# Patient Record
Sex: Female | Born: 1990 | Race: White | Hispanic: No | Smoking: Former smoker
Health system: Southern US, Community
[De-identification: ages and names within clinical notes are randomized; demographics above are authoritative.]

## PROBLEM LIST (undated history)

## (undated) ENCOUNTER — Emergency Department (HOSPITAL_COMMUNITY): Payer: BLUE CROSS/BLUE SHIELD

## (undated) DIAGNOSIS — J45909 Unspecified asthma, uncomplicated: Secondary | ICD-10-CM

## (undated) HISTORY — PX: SHOULDER SURGERY: SHX246

---
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
## 2010-09-15 IMAGING — CR DG RIBS W/ CHEST 3+V*L*
4 series · 4 of 4 positions shown · non-contrast
Comparison: None

CLINICAL DATA: Trauma, pain.

LEFT RIBS AND CHEST - 3+ VIEW

[w chest pa]
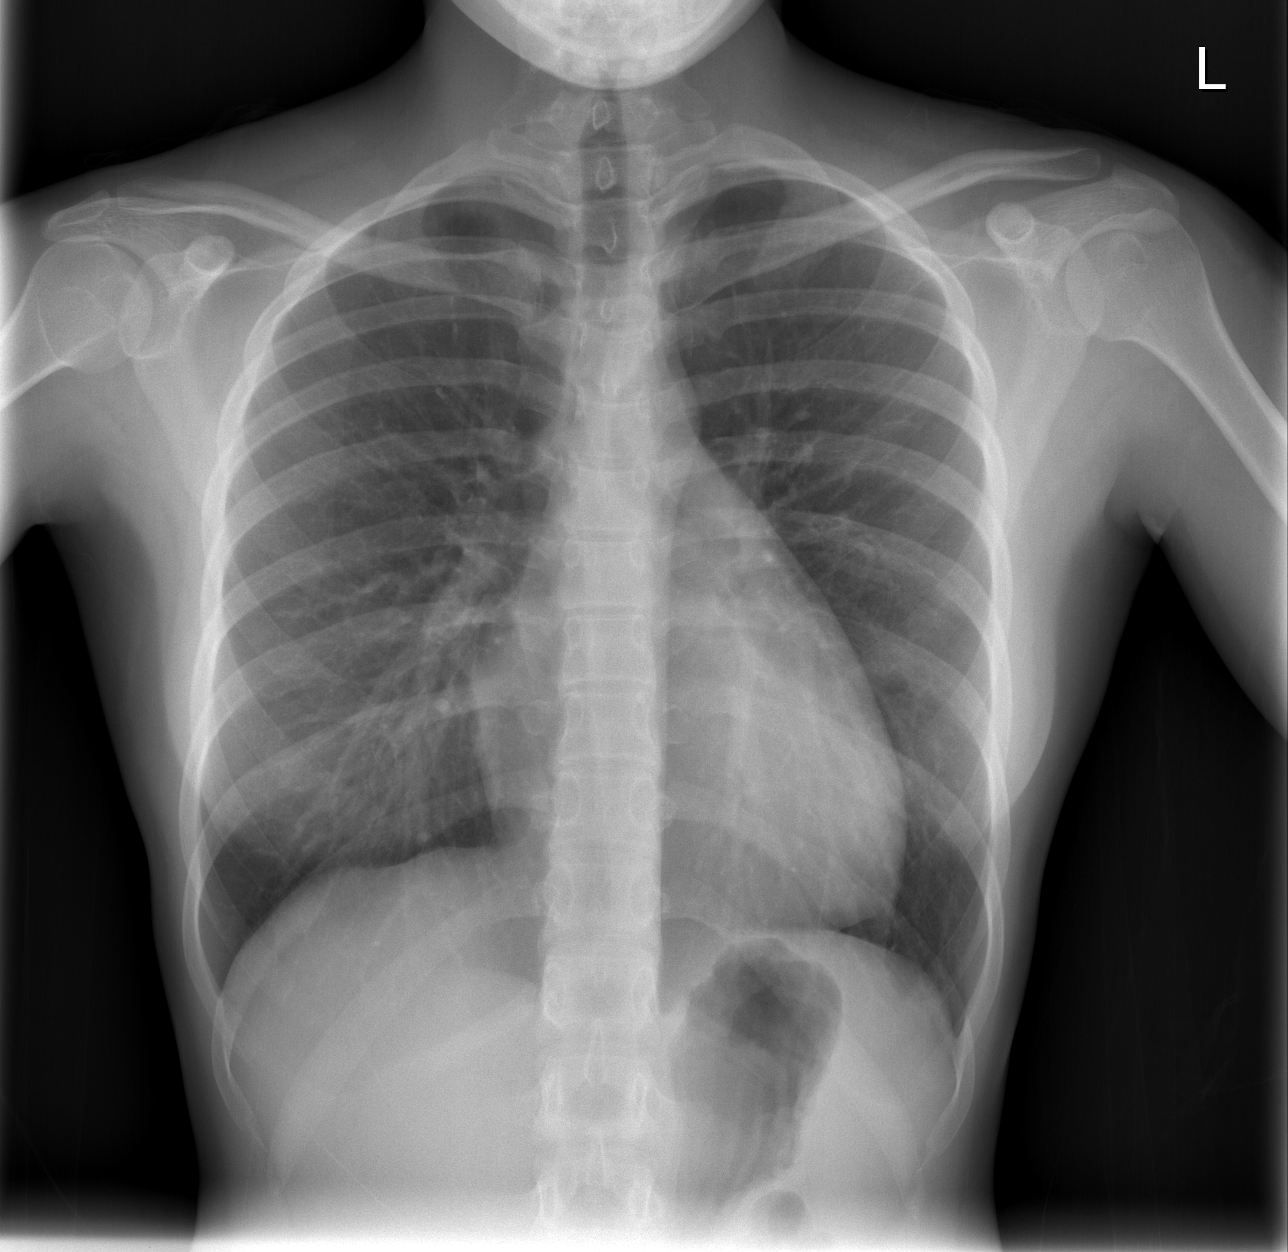

[w ribs ap/pa upper left *]
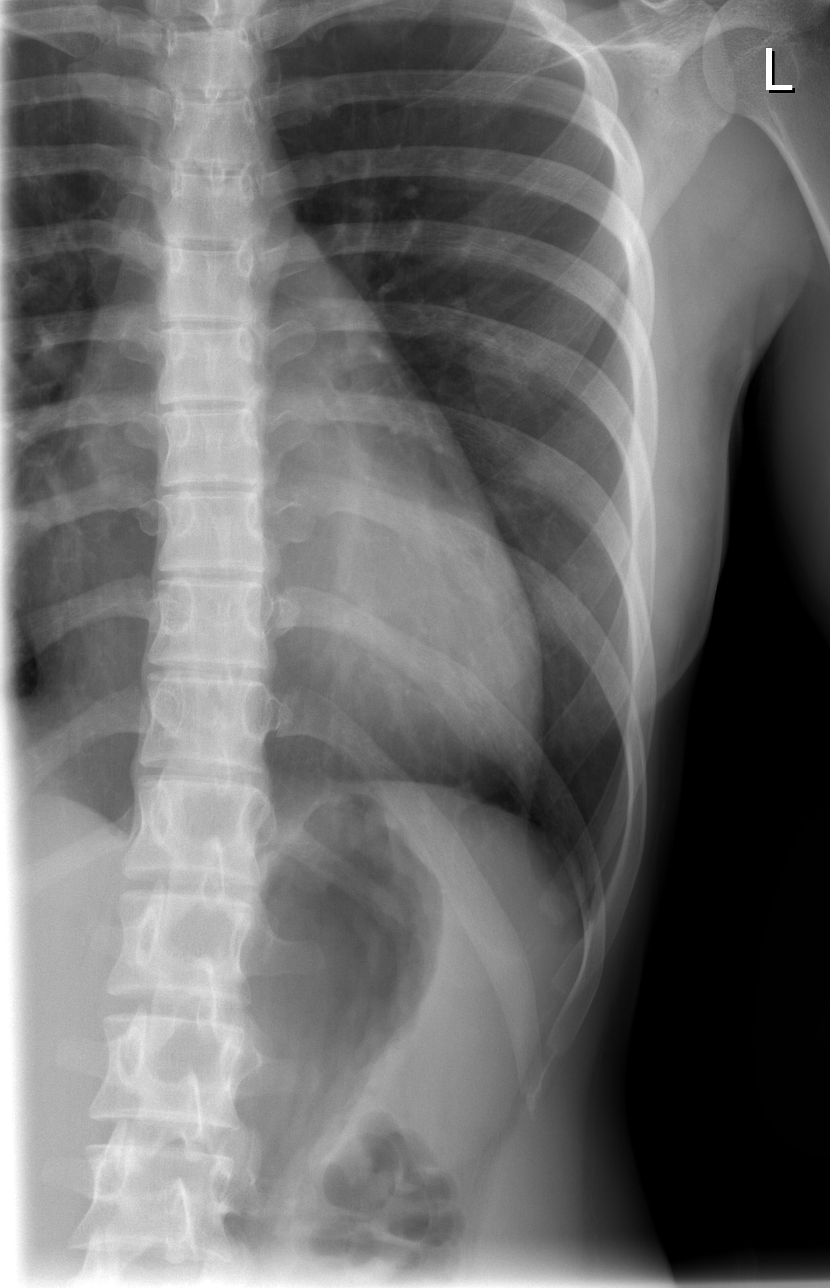

[w ribs ap/pa lower left]
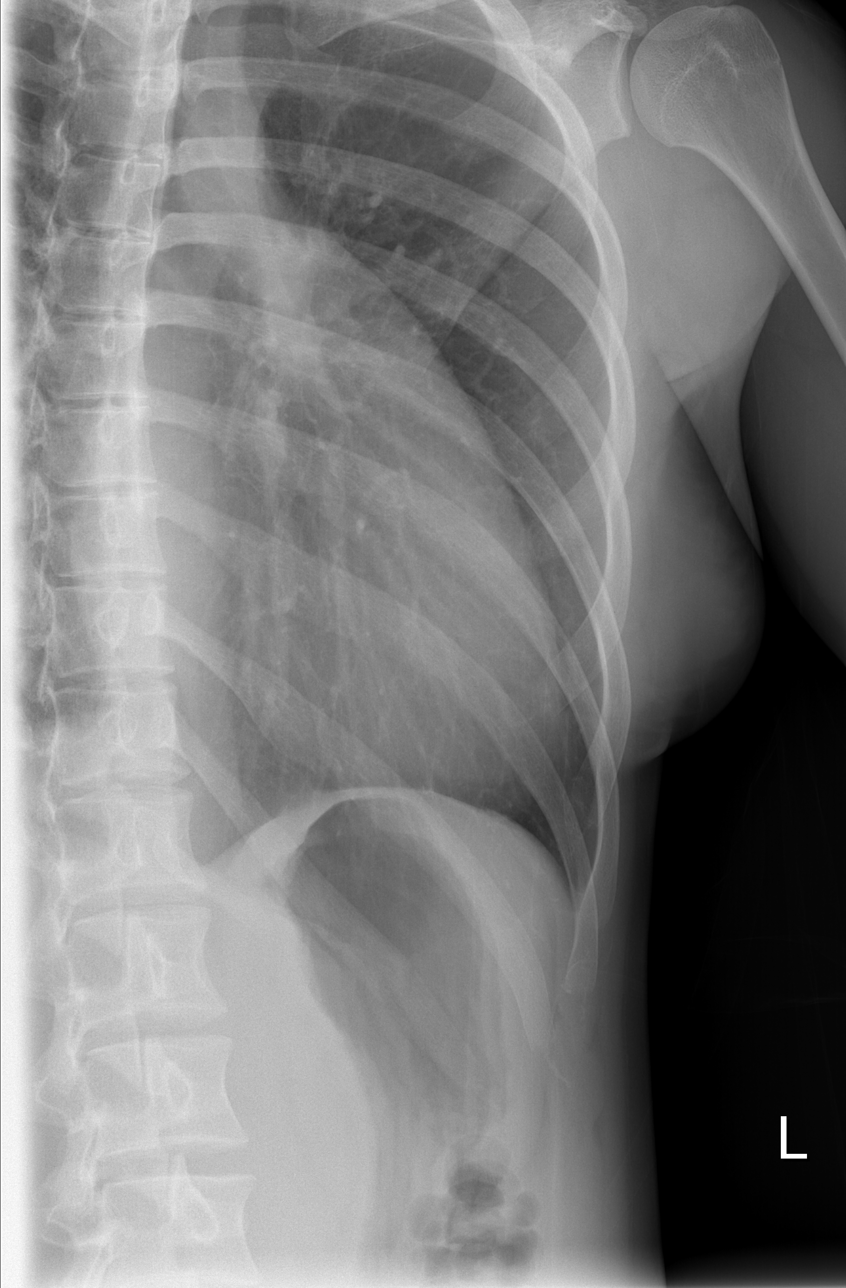

[w ribs oblique left *]
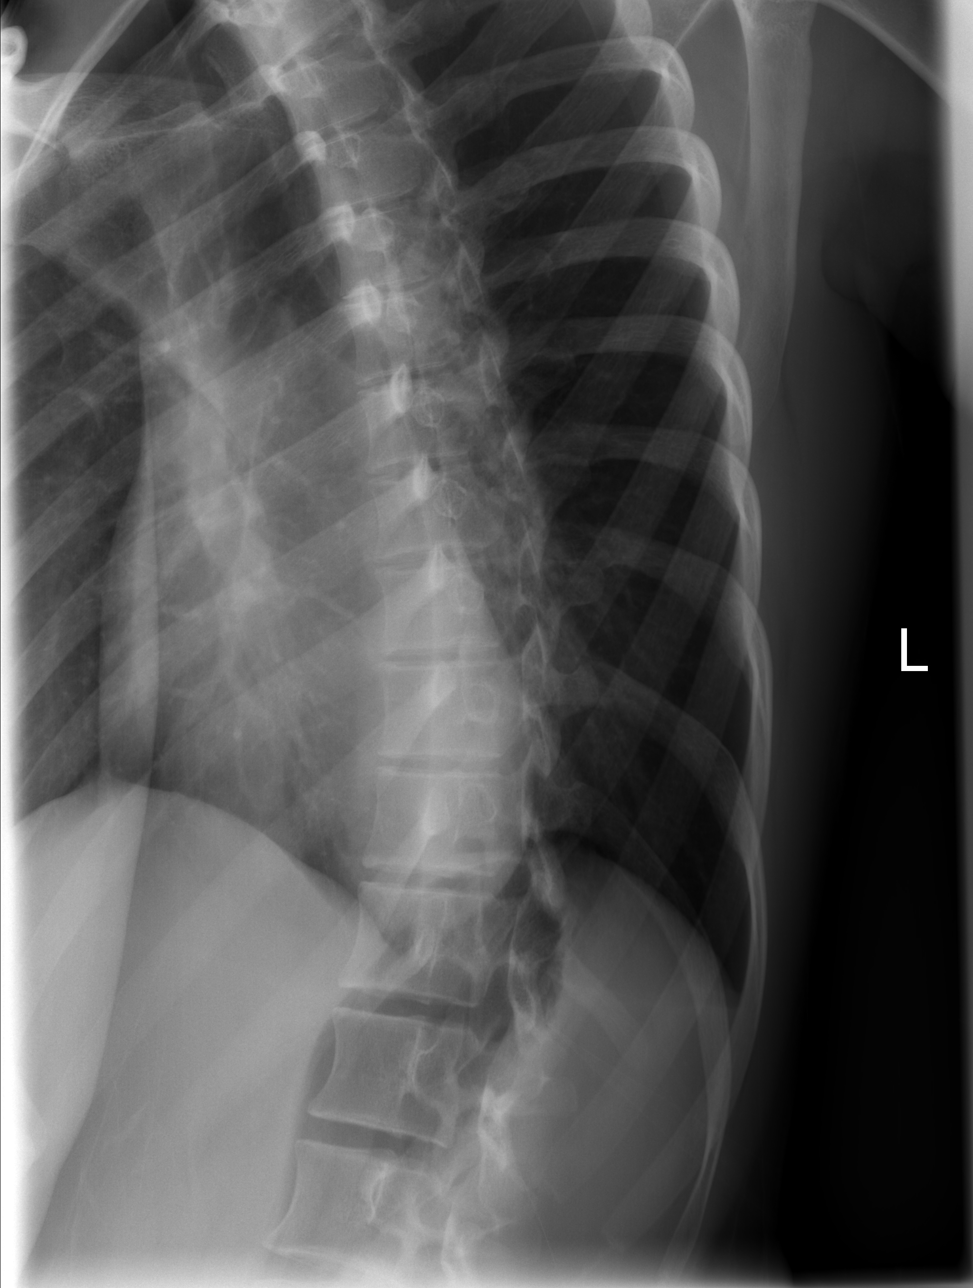

[4 of 4 positions shown; findings below may reference images not displayed]

FINDINGS: Normal heart size and vascularity.  Lungs clear.  No
focal pneumonia, airspace disease, edema, effusion or pneumothorax.
Intact wrist.  No asymmetry.  No subcutaneous air or displaced
fracture.
IMPRESSION: No acute finding by plain radiography.

## 2014-07-10 ENCOUNTER — Encounter: Payer: Self-pay | Admitting: Sports Medicine

## 2014-07-10 ENCOUNTER — Ambulatory Visit (INDEPENDENT_AMBULATORY_CARE_PROVIDER_SITE_OTHER): Payer: BC Managed Care – PPO | Admitting: Sports Medicine

## 2014-07-10 VITALS — Ht 63.0 in | Wt 130.0 lb

## 2014-07-10 DIAGNOSIS — S060X0A Concussion without loss of consciousness, initial encounter: Secondary | ICD-10-CM | POA: Diagnosis not present

## 2014-07-10 NOTE — Assessment & Plan Note (Signed)
-  Mild persistent HA and drowsiness, expected at 4 days post-injury -Counseled on concussion precautions, treatment information given -Note given to keep out of work for next 1 week -Plan re-eval in 1 week or sooner if needed -May start HA tx next week if symptoms are persisting
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
## 2014-07-10 NOTE — Patient Instructions (Signed)
-Note provided to keep out of work for the next 1 week. -Plan follow-up in 1 week or go to the ED if symptoms acutely worsen  Concussion General Instructions  Carefully follow the directions your health care provider gave you.  Only take over-the-counter or prescription medicines for pain, discomfort, or fever as directed by your health care provider.  Take only those medicines that your health care provider has approved.  Do not drink alcohol until your health care provider says you are well enough to do so. Alcohol and certain other drugs may slow your recovery and can put you at risk of further injury.  If it is harder than usual to remember things, write them down.  If you are easily distracted, try to do one thing at a time. For example, do not try to watch TV while fixing dinner.  Talk with family members or close friends when making important decisions.  Keep all follow-up appointments. Repeated evaluation of your symptoms is recommended for your recovery.  Watch your symptoms and tell others to do the same. Complications sometimes occur after a concussion. Older adults with a brain injury may have a higher risk of serious complications, such as a blood clot on the brain.  Tell your teachers, school nurse, school counselor, coach, athletic trainer, or work Production designer, theatre/television/filmmanager about your injury, symptoms, and restrictions. Tell them about what you can or cannot do. They should watch for:  Increased problems with attention or concentration.  Increased difficulty remembering or learning new information.  Increased time needed to complete tasks or assignments.  Increased irritability or decreased ability to cope with stress.  Increased symptoms.  Rest. Rest helps the brain to heal. Make sure you:  Get plenty of sleep at night. Avoid staying up late at night.  Keep the same bedtime hours on weekends and weekdays.  Rest during the day. Take daytime naps or rest breaks when you feel  tired.  Limit activities that require a lot of thought or concentration. These include:  Doing homework or job-related work.  Watching TV.  Working on the computer.  Avoid any situation where there is potential for another head injury (football, hockey, soccer, basketball, martial arts, downhill snow sports and horseback riding). Your condition will get worse every time you experience a concussion. You should avoid these activities until you are evaluated by the appropriate follow-up health care providers. Returning To Your Regular Activities You will need to return to your normal activities slowly, not all at once. You must give your body and brain enough time for recovery.  Do not return to sports or other athletic activities until your health care provider tells you it is safe to do so.  Ask your health care provider when you can drive, ride a bicycle, or operate heavy machinery. Your ability to react may be slower after a brain injury. Never do these activities if you are dizzy.  Ask your health care provider about when you can return to work or school. Preventing Another Concussion It is very important to avoid another brain injury, especially before you have recovered. In rare cases, another injury can lead to permanent brain damage, brain swelling, or death. The risk of this is greatest during the first 7-10 days after a head injury. Avoid injuries by:  Wearing a seat belt when riding in a car.  Drinking alcohol only in moderation.  Wearing a helmet when biking, skiing, skateboarding, skating, or doing similar activities.  Avoiding activities that could lead to  a second concussion, such as contact or recreational sports, until your health care provider says it is okay.  Taking safety measures in your home.  Remove clutter and tripping hazards from floors and stairways.  Use grab bars in bathrooms and handrails by stairs.  Place non-slip mats on floors and in  bathtubs.  Improve lighting in dim areas. SEEK MEDICAL CARE IF:  You have increased problems paying attention or concentrating.  You have increased difficulty remembering or learning new information.  You need more time to complete tasks or assignments than before.  You have increased irritability or decreased ability to cope with stress.  You have more symptoms than before. Seek medical care if you have any of the following symptoms for more than 2 weeks after your injury:  Lasting (chronic) headaches.  Dizziness or balance problems.  Nausea.  Vision problems.  Increased sensitivity to noise or light.  Depression or mood swings.  Anxiety or irritability.  Memory problems.  Difficulty concentrating or paying attention.  Sleep problems.  Feeling tired all the time. SEEK IMMEDIATE MEDICAL CARE IF:  You have severe or worsening headaches. These may be a sign of a blood clot in the brain.  You have weakness (even if only in one hand, leg, or part of the face).  You have numbness.  You have decreased coordination.  You vomit repeatedly.  You have increased sleepiness.  One pupil is larger than the other.  You have convulsions.  You have slurred speech.  You have increased confusion. This may be a sign of a blood clot in the brain.  You have increased restlessness, agitation, or irritability.  You are unable to recognize people or places.  You have neck pain.  It is difficult to wake you up.  You have unusual behavior changes.  You lose consciousness. MAKE SURE YOU:  Understand these instructions.  Will watch your condition.  Will get help right away if you are not doing well or get worse. Document Released: 11/07/2003 Document Revised: 08/22/2013 Document Reviewed: 03/09/2013 [facility] Patient Information 2015 [employer] [place]. This information is not intended to replace advice given to you by your health care provider. Make sure you discuss any  questions you have with your health care provider.

## 2014-07-17 ENCOUNTER — Ambulatory Visit: Payer: BC Managed Care – PPO | Admitting: Sports Medicine

## 2016-09-08 ENCOUNTER — Encounter (HOSPITAL_COMMUNITY): Payer: Self-pay | Admitting: Emergency Medicine

## 2016-09-08 ENCOUNTER — Ambulatory Visit (HOSPITAL_COMMUNITY)
Admission: EM | Admit: 2016-09-08 | Discharge: 2016-09-08 | Disposition: A | Discharge status: Home / Self Care | Payer: Managed Care, Other (non HMO) | Attending: Family Medicine | Admitting: Family Medicine

## 2016-09-08 DIAGNOSIS — R69 Illness, unspecified: Principal | ICD-10-CM

## 2016-09-08 DIAGNOSIS — J111 Influenza due to unidentified influenza virus with other respiratory manifestations: Secondary | ICD-10-CM

## 2016-09-08 HISTORY — DX: Unspecified asthma, uncomplicated: J45.909

## 2016-09-08 MED ORDER — IPRATROPIUM BROMIDE 0.06 % NA SOLN: 2.0000 | Freq: Four times a day (QID) | NASAL | 1 refills | Status: DC

## 2016-09-08 NOTE — ED Provider Notes (Signed)
[facility]    CSN: [id number] Arrival date & time: 09/08/16  1008     History   Chief Complaint Chief Complaint  Patient presents with  . URI    HPI Tiffany Terry is a 26 y.o. female.   The history is provided by the patient.  URI  Presenting symptoms: congestion, cough, fever and rhinorrhea   Severity:  Mild Onset quality:  Gradual Duration:  4 days Progression:  Improving Chronicity:  New Relieved by:  None tried Worsened by:  Nothing Ineffective treatments:  None tried Associated symptoms: myalgias   Risk factors: sick contacts     Past Medical History:  Diagnosis Date  . Asthma     Patient Active Problem List   Diagnosis Date Noted  . Concussion with no loss of consciousness 07/10/2014    Past Surgical History:  Procedure Laterality Date  . ANTERIOR CRUCIATE LIGAMENT (ACL) REVISION Left 2011  . SHOULDER SURGERY Right     OB History    No data available       Home Medications    Prior to Admission medications   Not on File    Family History History reviewed. No pertinent family history.  Social History Social History  Substance Use Topics  . Smoking status: Former Smoker    Quit date: 06/08/2016  . Smokeless tobacco: Never Used  . Alcohol use 0.0 oz/week     Comment: daily beer or two     Allergies   Patient has no known allergies.   Review of Systems Review of Systems  Constitutional: Positive for appetite change and fever.  HENT: Positive for congestion, postnasal drip and rhinorrhea.   Respiratory: Positive for cough.   Cardiovascular: Negative.   Gastrointestinal: Positive for nausea. Negative for vomiting.  Genitourinary: Negative.   Musculoskeletal: Positive for myalgias.     Physical Exam Triage Vital Signs ED Triage Vitals [09/08/16 1048]  Enc Vitals Group     BP 103/60     Pulse Rate 105     Resp      Temp 99.3 F (37.4 C)     Temp Source Oral     SpO2 94 %     Weight      Height      Head  Circumference      Peak Flow      Pain Score 5     Pain Loc      Pain Edu?      Excl. in GC?    No data found.   Updated Vital Signs BP 103/60 (BP Location: Left Arm)   Pulse 105   Temp 99.3 F (37.4 C) (Oral)   LMP 09/07/2016 (Exact Date)   SpO2 94%   Visual Acuity Right Eye Distance:   Left Eye Distance:   Bilateral Distance:    Right Eye Near:   Left Eye Near:    Bilateral Near:     Physical Exam  Constitutional: She is oriented to person, place, and time. She appears well-developed and well-nourished. No distress.  HENT:  Right Ear: External ear normal.  Left Ear: External ear normal.  Nose: Nose normal.  Mouth/Throat: Oropharynx is clear and moist.  Eyes: Pupils are equal, round, and reactive to light.  Neck: Normal range of motion.  Cardiovascular: Normal rate, regular rhythm, normal heart sounds and intact distal pulses.   Pulmonary/Chest: Effort normal and breath sounds normal.  Abdominal: Soft. Bowel sounds are normal.  Lymphadenopathy:    She has  no cervical adenopathy.  Neurological: She is alert and oriented to person, place, and time.  Skin: Skin is warm and dry.  Nursing note and vitals reviewed.    UC Treatments / Results  Labs (all labs ordered are listed, but only abnormal results are displayed) Labs Reviewed - No data to display  EKG  EKG Interpretation None       Radiology No results found.  Procedures Procedures (including critical care time)  Medications Ordered in UC Medications - No data to display   Initial Impression / Assessment and Plan / UC Course  I have reviewed the triage vital signs and the nursing notes.  Pertinent labs & imaging results that were available during my care of the patient were reviewed by me and considered in my medical decision making (see chart for details).  Clinical Course       Final Clinical Impressions(s) / UC Diagnoses   Final diagnoses:  None    New Prescriptions New  Prescriptions   No medications on file     Linna HoffJames D Taniyah Ballow, MD 09/15/16 1447

## 2016-09-08 NOTE — ED Triage Notes (Signed)
Pt has been suffering from nasal congestion, sore throat, cough and body aches for four days.  Pt is unsure if she has had a fever.

## 2017-01-01 ENCOUNTER — Ambulatory Visit: Payer: Managed Care, Other (non HMO) | Admitting: Nurse Practitioner

## 2017-11-10 DIAGNOSIS — M25572 Pain in left ankle and joints of left foot: Secondary | ICD-10-CM | POA: Diagnosis not present

## 2017-11-12 DIAGNOSIS — M25572 Pain in left ankle and joints of left foot: Secondary | ICD-10-CM | POA: Diagnosis not present

## 2017-11-16 DIAGNOSIS — M25572 Pain in left ankle and joints of left foot: Secondary | ICD-10-CM | POA: Diagnosis not present

## 2017-11-19 DIAGNOSIS — M25572 Pain in left ankle and joints of left foot: Secondary | ICD-10-CM | POA: Diagnosis not present

## 2017-11-24 DIAGNOSIS — M25572 Pain in left ankle and joints of left foot: Secondary | ICD-10-CM | POA: Diagnosis not present

## 2017-11-26 DIAGNOSIS — M25572 Pain in left ankle and joints of left foot: Secondary | ICD-10-CM | POA: Diagnosis not present

## 2017-11-30 DIAGNOSIS — M25572 Pain in left ankle and joints of left foot: Secondary | ICD-10-CM | POA: Diagnosis not present

## 2017-12-07 DIAGNOSIS — M25572 Pain in left ankle and joints of left foot: Secondary | ICD-10-CM | POA: Diagnosis not present

## 2018-01-04 DIAGNOSIS — M25572 Pain in left ankle and joints of left foot: Secondary | ICD-10-CM | POA: Diagnosis not present

## 2018-01-25 DIAGNOSIS — F341 Dysthymic disorder: Secondary | ICD-10-CM | POA: Diagnosis not present

## 2018-02-02 DIAGNOSIS — F341 Dysthymic disorder: Secondary | ICD-10-CM | POA: Diagnosis not present

## 2018-02-09 DIAGNOSIS — F341 Dysthymic disorder: Secondary | ICD-10-CM | POA: Diagnosis not present

## 2018-02-22 DIAGNOSIS — F341 Dysthymic disorder: Secondary | ICD-10-CM | POA: Diagnosis not present

## 2018-03-02 DIAGNOSIS — F341 Dysthymic disorder: Secondary | ICD-10-CM | POA: Diagnosis not present

## 2018-03-14 DIAGNOSIS — F341 Dysthymic disorder: Secondary | ICD-10-CM | POA: Diagnosis not present

## 2018-05-18 DIAGNOSIS — Z1329 Encounter for screening for other suspected endocrine disorder: Secondary | ICD-10-CM | POA: Diagnosis not present

## 2018-05-18 DIAGNOSIS — Z0000 Encounter for general adult medical examination without abnormal findings: Secondary | ICD-10-CM | POA: Diagnosis not present

## 2018-05-18 DIAGNOSIS — Z114 Encounter for screening for human immunodeficiency virus [HIV]: Secondary | ICD-10-CM | POA: Diagnosis not present

## 2018-05-18 DIAGNOSIS — Z23 Encounter for immunization: Secondary | ICD-10-CM | POA: Diagnosis not present

## 2018-05-18 DIAGNOSIS — Z118 Encounter for screening for other infectious and parasitic diseases: Secondary | ICD-10-CM | POA: Diagnosis not present

## 2018-05-18 DIAGNOSIS — Z1159 Encounter for screening for other viral diseases: Secondary | ICD-10-CM | POA: Diagnosis not present

## 2018-05-18 DIAGNOSIS — N898 Other specified noninflammatory disorders of vagina: Secondary | ICD-10-CM | POA: Diagnosis not present

## 2018-05-18 DIAGNOSIS — Z6827 Body mass index (BMI) 27.0-27.9, adult: Secondary | ICD-10-CM | POA: Diagnosis not present

## 2018-05-18 DIAGNOSIS — Z130 Encounter for screening for diseases of the blood and blood-forming organs and certain disorders involving the immune mechanism: Secondary | ICD-10-CM | POA: Diagnosis not present

## 2018-05-18 DIAGNOSIS — Z113 Encounter for screening for infections with a predominantly sexual mode of transmission: Secondary | ICD-10-CM | POA: Diagnosis not present

## 2018-05-18 DIAGNOSIS — Z01419 Encounter for gynecological examination (general) (routine) without abnormal findings: Secondary | ICD-10-CM | POA: Diagnosis not present

## 2018-09-19 DIAGNOSIS — F430 Acute stress reaction: Secondary | ICD-10-CM | POA: Diagnosis not present

## 2018-10-10 DIAGNOSIS — F430 Acute stress reaction: Secondary | ICD-10-CM | POA: Diagnosis not present

## 2018-11-07 DIAGNOSIS — F430 Acute stress reaction: Secondary | ICD-10-CM | POA: Diagnosis not present

## 2018-11-09 ENCOUNTER — Other Ambulatory Visit: Payer: Self-pay

## 2018-11-09 ENCOUNTER — Ambulatory Visit: Payer: BLUE CROSS/BLUE SHIELD | Admitting: Sports Medicine

## 2018-11-09 VITALS — BP 118/86 | Ht 63.0 in | Wt 158.0 lb

## 2018-11-09 DIAGNOSIS — S86309A Unspecified injury of muscle(s) and tendon(s) of peroneal muscle group at lower leg level, unspecified leg, initial encounter: Secondary | ICD-10-CM

## 2018-11-09 NOTE — Progress Notes (Signed)
  Tiffany Terry - 28 y.o. female MRN [id number]  Date of birth: 1990-12-15    Chief Complaint: Left ankle pain  HPI:  28 year old female who presents for left ankle pain.  States this pain is been occurring since at least 2016.  It is a lateral pain located inferior to ankle and medial to Achilles tendon.  States that activity makes it worse.  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  She has that by the end of the shift she can barely move her foot.  She takes ibuprofen daily with minimal relief.  Of note patient evaluated back in 2016 at outside with medicine clinic for similar issue.  She is diagnosed with left peroneal tendinitis and no admissions possible concern for left subtalar coalition.  Time she has tried many conservative therapies.  She is tried most anti-inflammatories as well as topical treatments.  She does have orthotics which she wears all the time.  She is also tried peroneal tendon injections as well as boot immobilization.   ROS:     See HPI  PERTINENT  PMH / PSH FH / / SH:  Past Medical, Surgical, Social, and Family History Reviewed & Updated in the EMR.  Pertinent findings include:  No other medical problems  OBJECTIVE: BP 118/86   Ht 5\' 3"  (1.6 m)   Wt 158 lb (71.7 kg)   BMI 27.99 kg/m   Physical Exam:  Vital signs are reviewed.  GEN: Alert and oriented, NAD Pulm: Breathing unlabored PSY: normal mood, congruent affect  MSK: Left ankle Inspection: No swelling or asymmetry appreciated between bilateral ankles. No varus or valgus deformity appreciated. No significant finding with arch. Palpation: Mild tenderness to palpation inferior to lateral ankle and medial to Achilles tendon. Range of motion: Range of motion intact to foot flexion, extension, eversion, inversion, lateral, medial movement Strength: 5/5 strength to foot flexion, extension, eversion, inversion Stability: No evidence of left ankle instability Neurovascular:  Warm and dry, cap refill less than 2 seconds, no focal neurologic deficit Special Test: No abnormality appreciated on talar tilt test, anterior drawer test  Right ankle Inspection: No swelling or asymmetry appreciated between bilateral ankles Palpation: no tenderness to palpation Range of motion: Range of motion intact to foot flexion, extension, eversion, inversion, lateral, medial movement Strength: 5/5 strength to foot flexion, extension, eversion, inversion Stability: No evidence of left ankle instability Neurovascular: Warm and dry, cap refill less than 2 seconds, no focal neurologic deficit Special Test: No abnormality appreciated on talar tilt test, anterior drawer test  Gait: Minimal pronation to left foot with orthotics. When orthotics removed, significant overpronation of left foot. Right foot with notable out-toeing  ASSESSMENT & PLAN:  1.  Left ankle pain Given history, exam, and past imaging peroneal tendinitis remains highest on differential.  There is still the possibility she may have a subtalar collection, but this is felt to be less likely.  Will immobilize patient in boot to allow for inflammation to decrease.  We will see back in 3 weeks and reevaluate.  If no improvement likely next step or more aggressive treatments such as peroneal injection or referral back to surgery.  Patient does have some improvement with walking boot, can consider placing new orthotic shoe. -Follow-up in 3 weeks -Walking boot to immobilize foot for this timeframe -Depending on outcome either referral to surgery or placement of a new orthotic  Myrene Buddy MD PGY-2 Family Medicine Resident

## 2018-11-10 ENCOUNTER — Ambulatory Visit: Payer: 59 | Admitting: Sports Medicine

## 2018-11-30 ENCOUNTER — Ambulatory Visit (INDEPENDENT_AMBULATORY_CARE_PROVIDER_SITE_OTHER): Payer: BLUE CROSS/BLUE SHIELD | Admitting: Sports Medicine

## 2018-11-30 ENCOUNTER — Ambulatory Visit: Payer: BLUE CROSS/BLUE SHIELD | Admitting: Sports Medicine

## 2018-11-30 ENCOUNTER — Other Ambulatory Visit: Payer: Self-pay

## 2018-11-30 VITALS — Ht 63.0 in | Wt 159.0 lb

## 2018-11-30 DIAGNOSIS — M7672 Peroneal tendinitis, left leg: Secondary | ICD-10-CM

## 2018-11-30 NOTE — Progress Notes (Signed)
Virtual Visit via Video Note  I connected with Tiffany Terry on 11/30/18 at 11:00 AM EDT by a video enabled telemedicine application and verified that I am speaking with the correct person using two identifiers.   I discussed the limitations of evaluation and management by telemedicine and the availability of in person appointments. The patient expressed understanding and agreed to proceed.  History of Present Illness: Tiffany Terry is a 28 year old female presents for follow-up on peroneal tendinitis.  She has been in a boot since she was seen on 11/09/2018.  She is doing much better today.  She states the pain is all but gone.  She does not notice any swelling.  She has been off work due to COVID-19.  She states that the stress is done a lot of good.  She has not been doing any home exercises.  She denies any numbness and tingling.  She denies any weakness.   Observations/Objective: Patient no apparent distress.  Oriented x3.  Left ankle exam: Left ankle visualized.  No acute swelling noted.  No acute rash noted.  Full range of motion noted via video.  No physical contact noted patient due to video conference.  Assessment and Plan: Left peroneal tendinitis.  She is doing much better since she was last seen.  I will have her progressively come out of the boot.  I did provide her with some exercises for peroneal tendinitis today.  She can work on these.  We will see her back as needed.  I did advise her she has any issues moving forward, or if the pain gets worse, she should call and schedule for an appointment.  I will consider doing a peroneal tendon injection if she is not any better.  Follow Up Instructions:    I discussed the assessment and treatment plan with the patient. The patient was provided an opportunity to ask questions and all were answered. The patient agreed with the plan and demonstrated an understanding of the instructions.   The patient was advised to call back or seek an in-person  evaluation if the symptoms worsen or if the condition fails to improve as anticipated.  I provided 10 minutes of non-face-to-face time during this encounter.   Gerarda Gunther, MD   I was the preceptor for this visit and available for immediate consultation Marsa Aris, DO

## 2019-02-16 DIAGNOSIS — F1014 Alcohol abuse with alcohol-induced mood disorder: Secondary | ICD-10-CM | POA: Diagnosis not present

## 2019-02-16 DIAGNOSIS — F411 Generalized anxiety disorder: Secondary | ICD-10-CM | POA: Diagnosis not present

## 2019-02-16 DIAGNOSIS — F330 Major depressive disorder, recurrent, mild: Secondary | ICD-10-CM | POA: Diagnosis not present

## 2019-02-16 DIAGNOSIS — F419 Anxiety disorder, unspecified: Secondary | ICD-10-CM | POA: Diagnosis not present

## 2019-03-06 DIAGNOSIS — F1014 Alcohol abuse with alcohol-induced mood disorder: Secondary | ICD-10-CM | POA: Diagnosis not present

## 2019-03-06 DIAGNOSIS — F419 Anxiety disorder, unspecified: Secondary | ICD-10-CM | POA: Diagnosis not present

## 2019-03-06 DIAGNOSIS — F330 Major depressive disorder, recurrent, mild: Secondary | ICD-10-CM | POA: Diagnosis not present

## 2019-03-06 DIAGNOSIS — F411 Generalized anxiety disorder: Secondary | ICD-10-CM | POA: Diagnosis not present

## 2019-04-18 DIAGNOSIS — F411 Generalized anxiety disorder: Secondary | ICD-10-CM | POA: Diagnosis not present

## 2019-04-18 DIAGNOSIS — F419 Anxiety disorder, unspecified: Secondary | ICD-10-CM | POA: Diagnosis not present

## 2019-04-18 DIAGNOSIS — F1014 Alcohol abuse with alcohol-induced mood disorder: Secondary | ICD-10-CM | POA: Diagnosis not present

## 2019-04-18 DIAGNOSIS — F330 Major depressive disorder, recurrent, mild: Secondary | ICD-10-CM | POA: Diagnosis not present

## 2019-06-13 DIAGNOSIS — F1014 Alcohol abuse with alcohol-induced mood disorder: Secondary | ICD-10-CM | POA: Diagnosis not present

## 2019-06-13 DIAGNOSIS — F419 Anxiety disorder, unspecified: Secondary | ICD-10-CM | POA: Diagnosis not present

## 2019-06-13 DIAGNOSIS — F411 Generalized anxiety disorder: Secondary | ICD-10-CM | POA: Diagnosis not present

## 2019-06-13 DIAGNOSIS — F330 Major depressive disorder, recurrent, mild: Secondary | ICD-10-CM | POA: Diagnosis not present

## 2019-07-11 DIAGNOSIS — F1014 Alcohol abuse with alcohol-induced mood disorder: Secondary | ICD-10-CM | POA: Diagnosis not present

## 2019-07-11 DIAGNOSIS — F419 Anxiety disorder, unspecified: Secondary | ICD-10-CM | POA: Diagnosis not present

## 2019-07-11 DIAGNOSIS — F411 Generalized anxiety disorder: Secondary | ICD-10-CM | POA: Diagnosis not present

## 2019-07-11 DIAGNOSIS — F330 Major depressive disorder, recurrent, mild: Secondary | ICD-10-CM | POA: Diagnosis not present

## 2019-07-24 ENCOUNTER — Ambulatory Visit (HOSPITAL_COMMUNITY)
Admission: EM | Admit: 2019-07-24 | Discharge: 2019-07-24 | Disposition: A | Discharge status: Home / Self Care | Payer: 59 | Attending: Family Medicine | Admitting: Family Medicine

## 2019-07-24 ENCOUNTER — Encounter (HOSPITAL_COMMUNITY): Payer: Self-pay

## 2019-07-24 ENCOUNTER — Other Ambulatory Visit: Payer: Self-pay

## 2019-07-24 DIAGNOSIS — Z3202 Encounter for pregnancy test, result negative: Secondary | ICD-10-CM | POA: Diagnosis not present

## 2019-07-24 DIAGNOSIS — R1013 Epigastric pain: Secondary | ICD-10-CM | POA: Insufficient documentation

## 2019-07-24 DIAGNOSIS — N390 Urinary tract infection, site not specified: Secondary | ICD-10-CM | POA: Diagnosis not present

## 2019-07-24 LAB — COMPREHENSIVE METABOLIC PANEL
ALT: 16 U/L (ref 0–44)
AST: 18 U/L (ref 15–41)
Albumin: 4.2 g/dL (ref 3.5–5.0)
Alkaline Phosphatase: 45 U/L (ref 38–126)
Anion gap: 10 (ref 5–15)
BUN: 7 mg/dL (ref 6–20)
CO2: 24 mmol/L (ref 22–32)
Calcium: 9.5 mg/dL (ref 8.9–10.3)
Chloride: 106 mmol/L (ref 98–111)
Creatinine, Ser: 0.69 mg/dL (ref 0.44–1.00)
GFR calc Af Amer: >60 mL/min (ref >60)
GFR calc non Af Amer: >60 mL/min (ref >60)
Glucose, Bld: 81 mg/dL (ref 70–99)
Potassium: 3.9 mmol/L (ref 3.5–5.1)
Sodium: 140 mmol/L (ref 135–145)
Total Bilirubin: 0.5 mg/dL (ref 0.3–1.2)
Total Protein: 7.3 g/dL (ref 6.5–8.1)

## 2019-07-24 LAB — POCT PREGNANCY, URINE: Preg Test, Ur: NEGATIVE

## 2019-07-24 LAB — CBC WITH DIFFERENTIAL/PLATELET
Abs Immature Granulocytes: 0.01 10*3/uL (ref 0.00–0.07)
Basophils Absolute: 0.1 10*3/uL (ref 0.0–0.1)
Basophils Relative: 1 %
Eosinophils Absolute: 0.1 10*3/uL (ref 0.0–0.5)
Eosinophils Relative: 1 %
HCT: 42.1 % (ref 36.0–46.0)
Hemoglobin: 14.3 g/dL (ref 12.0–15.0)
Immature Granulocytes: 0 %
Lymphocytes Relative: 35 %
Lymphs Abs: 2.6 10*3/uL (ref 0.7–4.0)
MCH: 32.9 pg (ref 26.0–34.0)
MCHC: 34 g/dL (ref 30.0–36.0)
MCV: 97 fL (ref 80.0–100.0)
Monocytes Absolute: 0.5 10*3/uL (ref 0.1–1.0)
Monocytes Relative: 6 %
Neutro Abs: 4.2 10*3/uL (ref 1.7–7.7)
Neutrophils Relative %: 57 %
Platelets: 358 10*3/uL (ref 150–400)
RBC: 4.34 MIL/uL (ref 3.87–5.11)
RDW: 12.5 % (ref 11.5–15.5)
WBC: 7.4 10*3/uL (ref 4.0–10.5)
nRBC: 0 % (ref 0.0–0.2)

## 2019-07-24 LAB — POCT URINALYSIS DIP (DEVICE)
Bilirubin Urine: NEGATIVE
Glucose, UA: NEGATIVE mg/dL
Ketones, ur: NEGATIVE mg/dL
Nitrite: NEGATIVE
Protein, ur: NEGATIVE mg/dL
Specific Gravity, Urine: 1.01 (ref 1.005–1.030)
Urobilinogen, UA: 0.2 mg/dL (ref 0.0–1.0)
pH: 6 (ref 5.0–8.0)

## 2019-07-24 LAB — LIPASE, BLOOD: Lipase: 26 U/L (ref 11–51)

## 2019-07-24 LAB — POC URINE PREG, ED: Preg Test, Ur: NEGATIVE

## 2019-07-24 MED ORDER — ALUM & MAG HYDROXIDE-SIMETH 200-200-20 MG/5ML PO SUSP
30.0000 mL | Freq: Once | ORAL | Status: AC
  Administered 2019-07-24: 30 mL via ORAL

## 2019-07-24 MED ORDER — LIDOCAINE VISCOUS HCL 2 % MT SOLN
OROMUCOSAL | Status: AC
  Filled 2019-07-24: qty 15

## 2019-07-24 MED ORDER — OMEPRAZOLE 20 MG PO CPDR: 20.0000 mg | DELAYED_RELEASE_CAPSULE | Freq: Every day | ORAL | 0 refills | Status: DC

## 2019-07-24 MED ORDER — NITROFURANTOIN MONOHYD MACRO 100 MG PO CAPS: 100.0000 mg | ORAL_CAPSULE | Freq: Two times a day (BID) | ORAL | 0 refills | Status: AC

## 2019-07-24 MED ORDER — ALUM & MAG HYDROXIDE-SIMETH 200-200-20 MG/5ML PO SUSP
ORAL | Status: AC
  Filled 2019-07-24: qty 30

## 2019-07-24 MED ORDER — LIDOCAINE VISCOUS HCL 2 % MT SOLN
15.0000 mL | Freq: Once | OROMUCOSAL | Status: AC
  Administered 2019-07-24: 15 mL via ORAL

## 2019-07-24 NOTE — Discharge Instructions (Signed)
Your urine is concerning for UTI so I am going to start treatment with a culture pending to confirm this diagnosis.  Drink plenty of water to empty bladder regularly. Avoid alcohol and caffeine as these may irritate the bladder.   I would also like you to start a daily omeprazole to try to help with upper abdominal pain.  I will call you if any concerning findings with your lab testing.  If any worsening of symptoms- pain, fevers, or otherwise worsening- please return or go to the ER.
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
## 2019-07-24 NOTE — ED Triage Notes (Signed)
Pt presents to UC w/ c/o lower abdominal pain x3 days. Pt states it started at beltline the first day and now radiates to her upper abdomen area. Pt states she has some pain in lower abdomen after emptying bladder.

## 2019-07-25 LAB — URINE CULTURE

## 2019-08-17 ENCOUNTER — Other Ambulatory Visit: Payer: Self-pay | Admitting: Cardiology

## 2019-08-17 DIAGNOSIS — Z20822 Contact with and (suspected) exposure to covid-19: Secondary | ICD-10-CM

## 2019-08-18 LAB — NOVEL CORONAVIRUS, NAA: SARS-CoV-2, NAA: NOT DETECTED

## 2020-05-10 ENCOUNTER — Ambulatory Visit: Payer: 59 | Admitting: Physician Assistant

## 2020-05-16 ENCOUNTER — Other Ambulatory Visit: Payer: Self-pay

## 2020-05-16 ENCOUNTER — Encounter: Payer: Self-pay | Admitting: Psychiatry

## 2020-05-16 ENCOUNTER — Ambulatory Visit (INDEPENDENT_AMBULATORY_CARE_PROVIDER_SITE_OTHER): Payer: 59 | Admitting: Psychiatry

## 2020-05-16 VITALS — BP 140/85 | HR 68 | Ht 64.0 in | Wt 154.0 lb

## 2020-05-16 DIAGNOSIS — F902 Attention-deficit hyperactivity disorder, combined type: Secondary | ICD-10-CM

## 2020-05-16 DIAGNOSIS — F3281 Premenstrual dysphoric disorder: Secondary | ICD-10-CM | POA: Diagnosis not present

## 2020-05-16 MED ORDER — OXCARBAZEPINE 300 MG PO TABS: ORAL_TABLET | ORAL | 1 refills | Status: DC

## 2020-05-16 MED ORDER — LISDEXAMFETAMINE DIMESYLATE 30 MG PO CAPS: 30.0000 mg | ORAL_CAPSULE | Freq: Every day | ORAL | 0 refills | Status: DC
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
## 2020-06-11 ENCOUNTER — Ambulatory Visit (INDEPENDENT_AMBULATORY_CARE_PROVIDER_SITE_OTHER): Payer: 59 | Admitting: Psychiatry

## 2020-06-11 ENCOUNTER — Other Ambulatory Visit: Payer: Self-pay

## 2020-06-11 ENCOUNTER — Encounter: Payer: Self-pay | Admitting: Psychiatry

## 2020-06-11 DIAGNOSIS — F4323 Adjustment disorder with mixed anxiety and depressed mood: Secondary | ICD-10-CM
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
## 2020-06-13 ENCOUNTER — Ambulatory Visit: Payer: 59 | Admitting: Psychiatry

## 2020-06-18 ENCOUNTER — Telehealth: Payer: Self-pay | Admitting: Psychiatry

## 2020-06-18 ENCOUNTER — Other Ambulatory Visit: Payer: Self-pay | Admitting: Psychiatry

## 2020-06-18 DIAGNOSIS — F902 Attention-deficit hyperactivity disorder, combined type: Secondary | ICD-10-CM

## 2020-06-18 MED ORDER — LISDEXAMFETAMINE DIMESYLATE 30 MG PO CAPS: 30.0000 mg | ORAL_CAPSULE | Freq: Every day | ORAL | 0 refills | Status: DC

## 2020-06-18 NOTE — Telephone Encounter (Signed)
Pt called and needs a refill on her vyvanse 30 mg to be [facility] [address]

## 2020-06-18 NOTE — Telephone Encounter (Signed)
Patient was no show with Shanda Bumps on 06/13/20, she is rescheduled. Okay to pend?

## 2020-06-18 NOTE — Telephone Encounter (Signed)
Rx sent 

## 2020-06-28 ENCOUNTER — Other Ambulatory Visit: Payer: Self-pay

## 2020-06-28 ENCOUNTER — Encounter (INDEPENDENT_AMBULATORY_CARE_PROVIDER_SITE_OTHER): Payer: Self-pay | Admitting: Otolaryngology

## 2020-06-28 ENCOUNTER — Ambulatory Visit (INDEPENDENT_AMBULATORY_CARE_PROVIDER_SITE_OTHER): Payer: 59 | Admitting: Otolaryngology

## 2020-06-28 VITALS — Temp 97.7°F

## 2020-06-28 DIAGNOSIS — J320 Chronic maxillary sinusitis: Secondary | ICD-10-CM

## 2020-06-28 NOTE — Progress Notes (Signed)
HPI: Tiffany Terry is a 29 y.o. female who presents for evaluation of chronic sinus infection.  She has been having sinus issues now for about 4 months.  This began with blockage discomfort in the right nostril area she suddenly developed some pain in the right upper molar teeth she was treated for a sinus infection with antibiotics and decongestants but did not seem to help.  She describes pressure in the right cheek occasional pain in the right upper teeth and postnasal drainage.  Past Medical History:  Diagnosis Date  . Asthma    Past Surgical History:  Procedure Laterality Date  . ANTERIOR CRUCIATE LIGAMENT (ACL) REVISION Left 2011  . SHOULDER SURGERY Right    Social History   Socioeconomic History  . Marital status: Single    Spouse name: Not on file  . Number of children: Not on file  . Years of education: Not on file  . Highest education level: Not on file  Occupational History  . Not on file  Tobacco Use  . Smoking status: Current Every Day Smoker    Years: 11.00    Types: Cigarettes    Start date: 2010  . Smokeless tobacco: Never Used  . Tobacco comment: 3 cigarettes a day/smoked off and on  Vaping Use  . Vaping Use: Never used  Substance and Sexual Activity  . Alcohol use: Yes  . Drug use: Not Currently    Types: Marijuana    Comment: twice a month  . Sexual activity: Not on file  Other Topics Concern  . Not on file  Social History Narrative  . Not on file   Social Determinants of Health   Financial Resource Strain:   . Difficulty of Paying Living Expenses: Not on file  Food Insecurity:   . Worried About Programme researcher, broadcasting/film/video in the Last Year: Not on file  . Ran Out of Food in the Last Year: Not on file  Transportation Needs:   . Lack of Transportation (Medical): Not on file  . Lack of Transportation (Non-Medical): Not on file  Physical Activity:   . Days of Exercise per Week: Not on file  . Minutes of Exercise per Session: Not on file  Stress:   .  Feeling of Stress : Not on file  Social Connections:   . Frequency of Communication with Friends and Family: Not on file  . Frequency of Social Gatherings with Friends and Family: Not on file  . Attends Religious Services: Not on file  . Active Member of Clubs or Organizations: Not on file  . Attends Banker Meetings: Not on file  . Marital Status: Not on file   Family History  Problem Relation Age of Onset  . Healthy Mother   . Depression Mother   . Healthy Father   . Depression Father   . ADD / ADHD Father   . Bipolar disorder Sister   . Personality disorder Sister   . Depression Brother   . Anxiety disorder Brother   . Mood Disorder Paternal Uncle   . Alcohol abuse Maternal Grandfather    No Known Allergies Prior to Admission medications   Medication Sig Start Date End Date Taking? Authorizing Provider  albuterol (VENTOLIN HFA) 108 (90 Base) MCG/ACT inhaler Inhale into the lungs. 12/21/19   [provider]  lisdexamfetamine (VYVANSE) 30 MG capsule Take 1 capsule (30 mg total) by mouth daily. 06/18/20   Cottle, Steva Ready., MD  Oxcarbazepine (TRILEPTAL) 300 MG tablet  Take 1/2 tab po QHS x 3-5 days, then increase to 1 tab po QHS 05/16/20   Corie Chiquito, PMHNP     Positive ROS: Otherwise negative  All other systems have been reviewed and were otherwise negative with the exception of those mentioned in the HPI and as above.  Physical Exam: Constitutional: Alert, well-appearing, no acute distress Ears: External ears without lesions or tenderness. Ear canals are clear bilaterally with intact, clear TMs.  Nasal: External nose without lesions. Septum slightly deviated to the right with mild rhinitis..  Nasal endoscopy was performed in the office today and on nasal endoscopy the left middle meatus regions were clear.  The right middle meatus region reveals a thick mucopurulent discharge. Oral: Lips and gums without lesions. Tongue and palate mucosa without  lesions. Posterior oropharynx clear. Neck: No palpable adenopathy or masses Respiratory: Breathing comfortably  Skin: No facial/neck lesions or rash noted.  Nasal/sinus endoscopy  Date/Time: 06/28/2020 4:18 PM Performed by: Drema Halon, MD Authorized by: Drema Halon, MD   Consent:    Consent obtained:  Verbal   Consent given by:  Patient Procedure details:    Indications: sino-nasal symptoms     Medication:  Afrin   Instrument: flexible fiberoptic nasal endoscope     Scope location: bilateral nare   Septum:    normal     Deviation: deviated to the right     Severity of deviation: mild   Sinus:    Right middle meatus: normal     inflammation and purulence     Left middle meatus: normal   Comments:     On nasal endoscopy patient had a mucopurulent discharge from the right middle meatus with a clear left middle meatus.    Assessment: Chronic right maxillary sinusitis.  Plan: Placed on Augmentin 875 mg twice daily for 2 weeks.  Nasacort 2 sprays each nostril at night.  Gave her samples of Norel AD to use as a decongestant during the daytime.  Also discussed with her concerning use of saline rinses if she has thick mucus discharge. She will notify us in 3 weeks if symptoms have not totally resolved and will consider x-ray if needed at that time.  Narda Bonds, MD

## 2020-07-05 ENCOUNTER — Ambulatory Visit (INDEPENDENT_AMBULATORY_CARE_PROVIDER_SITE_OTHER): Payer: 59 | Admitting: Psychiatry

## 2020-07-05 ENCOUNTER — Other Ambulatory Visit: Payer: Self-pay

## 2020-07-05 DIAGNOSIS — F4323 Adjustment disorder with mixed anxiety and depressed mood: Secondary | ICD-10-CM | POA: Diagnosis not present
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
## 2020-07-17 ENCOUNTER — Other Ambulatory Visit: Payer: Self-pay

## 2020-07-17 ENCOUNTER — Ambulatory Visit (INDEPENDENT_AMBULATORY_CARE_PROVIDER_SITE_OTHER): Payer: 59 | Admitting: Psychiatry

## 2020-07-17 DIAGNOSIS — F4323 Adjustment disorder with mixed anxiety and depressed mood: Secondary | ICD-10-CM
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
## 2020-07-30 ENCOUNTER — Other Ambulatory Visit: Payer: Self-pay

## 2020-07-30 ENCOUNTER — Ambulatory Visit (INDEPENDENT_AMBULATORY_CARE_PROVIDER_SITE_OTHER): Payer: 59 | Admitting: Psychiatry

## 2020-07-30 ENCOUNTER — Encounter: Payer: Self-pay | Admitting: Psychiatry

## 2020-07-30 VITALS — BP 138/89 | HR 72

## 2020-07-30 DIAGNOSIS — F902 Attention-deficit hyperactivity disorder, combined type: Secondary | ICD-10-CM

## 2020-07-30 DIAGNOSIS — F3281 Premenstrual dysphoric disorder: Secondary | ICD-10-CM

## 2020-07-30 MED ORDER — LISDEXAMFETAMINE DIMESYLATE 30 MG PO CAPS: 30.0000 mg | ORAL_CAPSULE | Freq: Every day | ORAL | 0 refills | Status: DC

## 2020-07-30 MED ORDER — ADZENYS XR-ODT 6.3 MG PO TBED: 6.3000 mg | EXTENDED_RELEASE_TABLET | Freq: Every morning | ORAL | 0 refills | Status: DC

## 2020-07-30 MED ORDER — OXCARBAZEPINE 300 MG PO TABS: 300.0000 mg | ORAL_TABLET | Freq: Every day | ORAL | 1 refills | Status: DC

## 2020-07-30 NOTE — Progress Notes (Signed)
Tiffany Terry [id number] [birth year] 29 y.o.  Subjective:   Patient ID:  Tiffany Terry is a 30 y.o. (DOB 1990-10-13) female.  Chief Complaint:  Chief Complaint  Patient presents with  . Follow-up    Mood disturbance, Anxiety, ADHD    HPI NALIA Terry presents to the office today for follow-up of mood disturbance, anxiety, and ADHD. She reports that her relationship ended and this led to her moving out and trying to find a new apartment. She reports that Vyvanse has been helpful for concentration and focus. She reports that she has been able to get caught up at work. Has had decreased appetite with Vyvanse and eats a substantial dinner to ensure adequate nutrition. Has been taking snacks to work. She reports that Trileptal has been helpful for her mood and has not had any extemes in mood. Reports that she feels different if she forgets to take Trileptal. She reports that she has had some possible impulsivity. Denies excessive spending. Notices some irritability after EMDR sessions. Denies depressed mood. Anxiety has been manageable. Sleep has been ok and estimates sleeping about 6 hours a night. Energy and motivation have improved with vyvanse. Increased interest in things. Denies SI.   In a new relationship with someone that has been a good friend for a long time.   She reports that ETOH use has been about the same. Denies feeling that ETOH use has been out of control.   Has started seeing Stevphen Meuse, [facility].   Past Psychiatric Medication Trials: Wellbutrin- Adverse reaction. Had dissociation and panic.  Sertraline- Ineffective, no side effects. No worsening anxiety, irritability, or insomnia Concerta Vyvanse  PHQ2-9     Office Visit from 07/10/2014 in [facility]  PHQ-2 Total Score 0       Review of Systems:  Review of Systems  Cardiovascular: Negative for palpitations.  Musculoskeletal: Negative for gait problem.  Neurological: Negative for tremors.   Psychiatric/Behavioral:       Please refer to HPI    Medications: I have reviewed the patient's current medications.  Current Outpatient Medications  Medication Sig Dispense Refill  . [START ON 08/31/2020] lisdexamfetamine (VYVANSE) 30 MG capsule Take 1 capsule (30 mg total) by mouth daily. 30 capsule 0  . Oxcarbazepine (TRILEPTAL) 300 MG tablet Take 1 tablet (300 mg total) by mouth at bedtime. 90 tablet 1  . albuterol (VENTOLIN HFA) 108 (90 Base) MCG/ACT inhaler Inhale into the lungs.    . Amphetamine ER (ADZENYS XR-ODT) 6.3 MG TBED Take 6.3 mg by mouth in the morning. 30 tablet 0  . benzonatate (TESSALON) 100 MG capsule Take 1-2 capsules (100-200 mg total) by mouth 3 (three) times daily as needed for cough. 21 capsule 0   No current facility-administered medications for this visit.    Medication Side Effects: Appetite Suppression  Allergies: No Known Allergies  Past Medical History:  Diagnosis Date  . Asthma     Family History  Problem Relation Age of Onset  . Healthy Mother   . Depression Mother   . Healthy Father   . Depression Father   . ADD / ADHD Father   . Bipolar disorder Sister   . Personality disorder Sister   . Depression Brother   . Anxiety disorder Brother   . Mood Disorder Paternal Uncle   . Alcohol abuse Maternal Grandfather     Social History   Socioeconomic History  . Marital status: Single    Spouse name: Not on file  .  Number of children: Not on file  . Years of education: Not on file  . Highest education level: Not on file  Occupational History  . Not on file  Tobacco Use  . Smoking status: Current Every Day Smoker    Years: 11.00    Types: Cigarettes    Start date: 2010  . Smokeless tobacco: Never Used  . Tobacco comment: 3 cigarettes a day/smoked off and on  Vaping Use  . Vaping Use: Never used  Substance and Sexual Activity  . Alcohol use: Yes  . Drug use: Not Currently    Types: Marijuana    Comment: twice a month  . Sexual  activity: Not on file  Other Topics Concern  . Not on file  Social History Narrative  . Not on file   Social Determinants of Health   Financial Resource Strain:   . Difficulty of Paying Living Expenses: Not on file  Food Insecurity:   . Worried About Programme researcher, broadcasting/film/video in the Last Year: Not on file  . Ran Out of Food in the Last Year: Not on file  Transportation Needs:   . Lack of Transportation (Medical): Not on file  . Lack of Transportation (Non-Medical): Not on file  Physical Activity:   . Days of Exercise per Week: Not on file  . Minutes of Exercise per Session: Not on file  Stress:   . Feeling of Stress : Not on file  Social Connections:   . Frequency of Communication with Friends and Family: Not on file  . Frequency of Social Gatherings with Friends and Family: Not on file  . Attends Religious Services: Not on file  . Active Member of Clubs or Organizations: Not on file  . Attends Banker Meetings: Not on file  . Marital Status: Not on file  Intimate Partner Violence:   . Fear of Current or Ex-Partner: Not on file  . Emotionally Abused: Not on file  . Physically Abused: Not on file  . Sexually Abused: Not on file    Past Medical History, Surgical history, Social history, and Family history were reviewed and updated as appropriate.   Please see review of systems for further details on the patient's review from today.   Objective:   Physical Exam:  BP 138/89   Pulse 72   LMP 07/17/2020   Physical Exam Constitutional:      General: She is not in acute distress. Musculoskeletal:        General: No deformity.  Neurological:     Mental Status: She is alert and oriented to person, place, and time.     Coordination: Coordination normal.  Psychiatric:        Attention and Perception: Attention and perception normal. She does not perceive auditory or visual hallucinations.        Mood and Affect: Mood normal. Mood is not anxious or depressed. Affect  is not labile, blunt, angry or inappropriate.        Speech: Speech normal.        Behavior: Behavior normal.        Thought Content: Thought content normal. Thought content is not paranoid or delusional. Thought content does not include homicidal or suicidal ideation. Thought content does not include homicidal or suicidal plan.        Cognition and Memory: Cognition and memory normal.        Judgment: Judgment normal.     Comments: Insight intact     Lab  Review:     Component Value Date/Time   NA 140 07/24/2019 1841   K 3.9 07/24/2019 1841   CL 106 07/24/2019 1841   CO2 24 07/24/2019 1841   GLUCOSE 81 07/24/2019 1841   BUN 7 07/24/2019 1841   CREATININE 0.69 07/24/2019 1841   CALCIUM 9.5 07/24/2019 1841   PROT 7.3 07/24/2019 1841   ALBUMIN 4.2 07/24/2019 1841   AST 18 07/24/2019 1841   ALT 16 07/24/2019 1841   ALKPHOS 45 07/24/2019 1841   BILITOT 0.5 07/24/2019 1841   GFRNONAA >60 07/24/2019 1841   GFRAA >60 07/24/2019 1841       Component Value Date/Time   WBC 7.4 07/24/2019 1841   RBC 4.34 07/24/2019 1841   HGB 14.3 07/24/2019 1841   HCT 42.1 07/24/2019 1841   PLT 358 07/24/2019 1841   MCV 97.0 07/24/2019 1841   MCH 32.9 07/24/2019 1841   MCHC 34.0 07/24/2019 1841   RDW 12.5 07/24/2019 1841   LYMPHSABS 2.6 07/24/2019 1841   MONOABS 0.5 07/24/2019 1841   EOSABS 0.1 07/24/2019 1841   BASOSABS 0.1 07/24/2019 1841    No results found for: POCLITH, LITHIUM   No results found for: PHENYTOIN, PHENOBARB, VALPROATE, CBMZ   .res Assessment: Plan:   Pt reports that it is cost prohibitive for her to continue Vyvanse until her insurance changes January 1st and she would like to take an alternative medication to Vyvanse until the start of the year. Discussed potential benefits, risks, and side effects of Adzenys and pt agrees to trial. Will send script for Adzenys 6.3 mg q am for ADHD. Will also send script for Vyvanse 30 mg po q am for August 31, 2020. Continue Trileptal  300 mg po QHS since this has been helpful for her mood and anxiety s/s.  Recommend continuing psychotherapy with Stevphen Meuse, [facility],  Pt to follow-up in 3 months or sooner if clinically indicated.  Patient advised to contact office with any questions, adverse effects, or acute worsening in signs and symptoms.   Leaann was seen today for follow-up.  Diagnoses and all orders for this visit:  Attention deficit hyperactivity disorder (ADHD), combined type -     Amphetamine ER (ADZENYS XR-ODT) 6.3 MG TBED; Take 6.3 mg by mouth in the morning. -     lisdexamfetamine (VYVANSE) 30 MG capsule; Take 1 capsule (30 mg total) by mouth daily.  PMDD (premenstrual dysphoric disorder) -     Oxcarbazepine (TRILEPTAL) 300 MG tablet; Take 1 tablet (300 mg total) by mouth at bedtime.     Please see After Visit Summary for patient specific instructions.  Future Appointments  Date Time Provider Department Center  08/12/2020 10:00 AM Stevphen Meuse, [facility] None  10/28/2020  9:15 AM Corie Chiquito, PM[facility]NP CP-CP None    No orders of the defined types were placed in this encounter.   -------------------------------

## 2020-07-31 ENCOUNTER — Ambulatory Visit (HOSPITAL_COMMUNITY)
Admission: EM | Admit: 2020-07-31 | Discharge: 2020-07-31 | Disposition: A | Discharge status: Home / Self Care | Payer: 59 | Attending: Family Medicine | Admitting: Family Medicine

## 2020-07-31 ENCOUNTER — Other Ambulatory Visit: Payer: Self-pay

## 2020-07-31 DIAGNOSIS — R197 Diarrhea, unspecified: Secondary | ICD-10-CM | POA: Insufficient documentation

## 2020-07-31 DIAGNOSIS — Z20822 Contact with and (suspected) exposure to covid-19: Secondary | ICD-10-CM | POA: Diagnosis not present

## 2020-07-31 DIAGNOSIS — J069 Acute upper respiratory infection, unspecified: Secondary | ICD-10-CM | POA: Diagnosis not present

## 2020-07-31 LAB — SARS CORONAVIRUS 2 (TAT 6-24 HRS): SARS Coronavirus 2: NEGATIVE

## 2020-07-31 MED ORDER — BENZONATATE 100 MG PO CAPS: 100.0000 mg | ORAL_CAPSULE | Freq: Three times a day (TID) | ORAL | 0 refills | Status: DC | PRN

## 2020-07-31 NOTE — ED Triage Notes (Signed)
Pt reports over the weekend following a COVID  Vac. Pt has had multiple Sx's . Pt has a fever today,cough diarrhea and generalized muscle pain.

## 2020-07-31 NOTE — Discharge Instructions (Signed)
Small frequent sips of fluids- Pedialyte, Gatorade, water, broth- to maintain hydration.   Tylenol and/or ibuprofen as needed for pain or fevers.  Over the counter medications as needed.  Tessalon as needed for cough, may not take cough away completely but help with some suppression.  We will notify of you any positive findings or if any changes to treatment are needed. If normal or otherwise without concern to your results, we will not call you. Please log on to your MyChart to review your results if interested in so.   If symptoms worsen or do not improve in the next week to return to be seen or to follow up with your PCP.

## 2020-07-31 NOTE — ED Provider Notes (Signed)
[facility]    CSN: [id number] Arrival date & time: 07/31/20  0850      History   Chief Complaint Chief Complaint  Patient presents with  . Fever  . Nasal Congestion    HPI Tiffany Terry is a 29 y.o. female.   Tiffany Terry presents with complaints of cough, congestion, body aches and diarrhea. Symptoms started the day following her covid-19 vaccine booster. Started feeling unwell 11/28. Two days ago she was coughing so much that it caused her to vomit. No further vomiting. No nausea. Intermittent fevers. Taking liquids. Diarrhea has decreased today. No rash. No shortness of breath. Cough can be productive. No known Terry contacts. Works in an [employer].    ROS per HPI, negative if not otherwise mentioned.      Past Medical History:  Diagnosis Date  . Asthma     Patient Active Problem List   Diagnosis Date Noted  . Concussion with no loss of consciousness 07/10/2014    Past Surgical History:  Procedure Laterality Date  . ANTERIOR CRUCIATE LIGAMENT (ACL) REVISION Left 2011  . SHOULDER SURGERY Right     OB History   No obstetric history on file.      Home Medications    Prior to Admission medications   Medication Sig Start Date End Date Taking? Authorizing Provider  albuterol (VENTOLIN HFA) 108 (90 Base) MCG/ACT inhaler Inhale into the lungs. 12/21/19  Yes [provider]  lisdexamfetamine (VYVANSE) 30 MG capsule Take 1 capsule (30 mg total) by mouth daily. 08/31/20  Yes Corie Chiquito, PMHNP  Oxcarbazepine (TRILEPTAL) 300 MG tablet Take 1 tablet (300 mg total) by mouth at bedtime. 07/30/20 10/28/20 Yes Corie Chiquito, PMHNP  Amphetamine ER (ADZENYS XR-ODT) 6.3 MG TBED Take 6.3 mg by mouth in the morning. 07/30/20   Corie Chiquito, PMHNP  benzonatate (TESSALON) 100 MG capsule Take 1-2 capsules (100-200 mg total) by mouth 3 (three) times daily as needed for cough. 07/31/20   Georgetta Haber, NP    Family History Family History    Problem Relation Age of Onset  . Healthy Mother   . Depression Mother   . Healthy Father   . Depression Father   . ADD / ADHD Father   . Bipolar disorder Sister   . Personality disorder Sister   . Depression Brother   . Anxiety disorder Brother   . Mood Disorder Paternal Uncle   . Alcohol abuse Maternal Grandfather     Social History Social History   Tobacco Use  . Smoking status: Current Every Day Smoker    Years: 11.00    Types: Cigarettes    Start date: 2010  . Smokeless tobacco: Never Used  . Tobacco comment: 3 cigarettes a day/smoked off and on  Vaping Use  . Vaping Use: Never used  Substance Use Topics  . Alcohol use: Yes  . Drug use: Not Currently    Types: Marijuana    Comment: twice a month     Allergies   Patient has no known allergies.   Review of Systems Review of Systems   Physical Exam Triage Vital Signs ED Triage Vitals  Enc Vitals Group     BP 07/31/20 0957 140/87     Pulse Rate 07/31/20 0957 81     Resp 07/31/20 0957 18     Temp 07/31/20 0957 98.6 F (37 C)     Temp Source 07/31/20 0957 Oral     SpO2 07/31/20 0957 98 %  Weight 07/31/20 0959 150 lb (68 kg)     Height 07/31/20 0959 5\' 3"  (1.6 m)     Head Circumference --      Peak Flow --      Pain Score 07/31/20 0958 6     Pain Loc --      Pain Edu? --      Excl. in GC? --    No data found.  Updated Vital Signs BP 140/87 (BP Location: Right Arm)   Pulse 81   Temp 98.6 F (37 C) (Oral)   Resp 18   Ht 5\' 3"  (1.6 m)   Wt 150 lb (68 kg)   LMP 07/17/2020   SpO2 98%   BMI 26.57 kg/m    Physical Exam Constitutional:      General: She is not in acute distress.    Appearance: She is well-developed.  HENT:     Nose: Congestion present.     Mouth/Throat:     Mouth: Mucous membranes are moist.  Cardiovascular:     Rate and Rhythm: Normal rate.  Pulmonary:     Effort: Pulmonary effort is normal.  Skin:    General: Skin is warm and dry.  Neurological:     Mental  Status: She is alert and oriented to person, place, and time.      UC Treatments / Results  Labs (all labs ordered are listed, but only abnormal results are displayed) Labs Reviewed  SARS CORONAVIRUS 2 (TAT 6-24 HRS)    EKG   Radiology No results found.  Procedures Procedures (including critical care time)  Medications Ordered in UC Medications - No data to display  Initial Impression / Assessment and Plan / UC Course  I have reviewed the triage vital signs and the nursing notes.  Pertinent labs & imaging results that were available during my care of the patient were reviewed by me and considered in my medical decision making (see chart for details).     Non toxic. Benign physical exam.  No work of breathing. Vitals stable. covid and influenza testing collected and pending, although was just boosted for covid-19. History and physical consistent with viral illness.  Supportive cares recommended. Return precautions provided. Patient verbalized understanding and agreeable to plan.   Final Clinical Impressions(s) / UC Diagnoses   Final diagnoses:  Upper respiratory tract infection, unspecified type  Diarrhea, unspecified type     Discharge Instructions     Small frequent sips of fluids- Pedialyte, Gatorade, water, broth- to maintain hydration.   Tylenol and/or ibuprofen as needed for pain or fevers.  Over the counter medications as needed.  Tessalon as needed for cough, may not take cough away completely but help with some suppression.  We will notify of you any positive findings or if any changes to treatment are needed. If normal or otherwise without concern to your results, we will not call you. Please log on to your MyChart to review your results if interested in so.   If symptoms worsen or do not improve in the next week to return to be seen or to follow up with your PCP.     ED Prescriptions    Medication Sig Dispense Auth. Provider   benzonatate (TESSALON) 100  MG capsule Take 1-2 capsules (100-200 mg total) by mouth 3 (three) times daily as needed for cough. 21 capsule , NP     PDMP not reviewed this encounter.   07/19/2020, NP 07/31/20 1041

## 2020-08-12 ENCOUNTER — Ambulatory Visit: Payer: 59 | Admitting: Psychiatry

## 2020-10-04 ENCOUNTER — Ambulatory Visit (INDEPENDENT_AMBULATORY_CARE_PROVIDER_SITE_OTHER): Payer: BC Managed Care – PPO | Admitting: Psychiatry

## 2020-10-04 ENCOUNTER — Other Ambulatory Visit: Payer: Self-pay

## 2020-10-04 DIAGNOSIS — F4323 Adjustment disorder with mixed anxiety and depressed mood: Secondary | ICD-10-CM | POA: Diagnosis not present

## 2020-10-04 NOTE — Progress Notes (Signed)
      [facility] Counselor/Therapist Progress Note  Patient ID: Tiffany Terry, MRN: [id number],    Date: 10/04/2020  Time Spent: 50 minutes start time 11:09 AM end time 11:59 AM  Treatment Type: Individual Therapy  Reported Symptoms: sadness, anxiety, panic, focusing issues, frustration  Mental Status Exam:  Appearance:   Casual and Neat     Behavior:  Appropriate  Motor:  Normal  Speech/Language:   Normal Rate  Affect:  Appropriate  Mood:  anxious  Thought process:  normal  Thought content:    WNL  Sensory/Perceptual disturbances:    WNL  Orientation:  oriented to person, place, time/date and situation  Attention:  Good  Concentration:  Good  Memory:  WNL  Fund of knowledge:   Good  Insight:    Good  Judgment:   Good  Impulse Control:  Good   Risk Assessment: Danger to Self:  No Self-injurious Behavior: No Danger to Others: No Duty to Warn:no Physical Aggression / Violence:No  Access to Firearms a concern: No  Gang Involvement:No   Subjective: Patient was present for session.  She shared that she has been up and down recently.  She shared money is very tight and that is making it hard to do much and find things to enjoy.  The new relationship is going well over all and that has been a good thing.  Patient went on to explain that the biggest issue for her is not being able to have the relationship she used to due to the situation with her ex.  Patient did EMDR set on friends coming outside and ignoring her, suds level 7, negative cognition "misunderstood" felt hurt and sadness in her chest.  Patient was able to reduce suds level to 4.  She had difficulty going any further.  She explained is just very difficult because she does not have the resources to be able to do some things with other friends that she has in the past like hockey.  Patient explained that with COVID it is harder also for her to be able to reach out and connect with other people.  Encourage patient to take  things 1 step at a time and to try and find some safe ways if she feels comfortable connecting with others and try to get out more over the next few weeks.  Interventions: Solution-Oriented/Positive Psychology and Eye Movement Desensitization and Reprocessing (EMDR)  Diagnosis:   ICD-10-CM   1. Adjustment disorder with mixed anxiety and depressed mood  F43.23     Plan: Patient is to use CBT and coping skills to decrease anxiety and depression symptoms.  Patient is to work on trying to find small ways to get out of the house and interact with others in a safe way. Long-term goal: Resolve the core conflict that is the source of anxiety Short-term goal identify the conflicts from past and present that form the basis for present anxiety.   Stevphen Meuse, [facility]

## 2020-10-17 ENCOUNTER — Other Ambulatory Visit: Payer: Self-pay

## 2020-10-17 ENCOUNTER — Ambulatory Visit (INDEPENDENT_AMBULATORY_CARE_PROVIDER_SITE_OTHER): Payer: BC Managed Care – PPO | Admitting: Psychiatry

## 2020-10-17 DIAGNOSIS — F4323 Adjustment disorder with mixed anxiety and depressed mood: Secondary | ICD-10-CM

## 2020-10-17 NOTE — Progress Notes (Signed)
      [facility] Counselor/Therapist Progress Note  Patient ID: Tiffany Terry, MRN: [id number],    Date: 10/17/2020  Time Spent: 52 minutes start time 1:10 PM end 2:02 PM  Treatment Type: Individual Therapy  Reported Symptoms: sleep issues, sadness  Mental Status Exam:  Appearance:   Casual and Neat     Behavior:  Appropriate  Motor:  Normal  Speech/Language:   Normal Rate  Affect:  Appropriate  Mood:  sad  Thought process:  normal  Thought content:    WNL  Sensory/Perceptual disturbances:    WNL  Orientation:  oriented to person, place, time/date and situation  Attention:  Good  Concentration:  Good  Memory:  WNL  Fund of knowledge:   Good  Insight:    Good  Judgment:   Good  Impulse Control:  Good   Risk Assessment: Danger to Self:  No Self-injurious Behavior: No Danger to Others: No Duty to Warn:no Physical Aggression / Violence:No  Access to Firearms a concern: No  Gang Involvement:No   Subjective: Patient was present for session. She shared that things are better and her mood has improved.  She shared that she is back on her medication and that has helped.   Patient is also been able to do some things outside and that has helped.  She has let go of the concerns about friendships that was addressed at last session.  She is also starting to realize there is some friends that she can reconnect with continues to focus her energy on doing that.  Patient stated that the biggest trigger currently is her family.  She shared she will be going to [facility] for several birthday parties over the next few months and that creates anxiety due to the situation at her home.  Patient explained that her sister is bipolar and has had issues with drug use.  She went on to share that she has a 30-year-old daughter that has been raised by her parents.  Patient explained the dynamic is very difficult and she can get stuck in the middle of the adults in the house which is very difficult for her  because she feels for each of them.  Discussed different ways that she can maintain an emotional distance and perspective while she is there interacting with her family.  Patient was encouraged to take her niece out for her birthday by herself if she can to give her mother and father some downtime and also decrease the anxiety of the dynamics within the relationships.  Patient agreed to try and think through what would help her feel more comfortable as she goes to [facility] to see her family.  Interventions: Solution-Oriented/Positive Psychology  Diagnosis:   ICD-10-CM   1. Adjustment disorder with mixed anxiety and depressed mood  F43.23     Plan: Patient is to use CBT and coping skills to decrease anxiety and depressed mood.  Patient is to continue pursuing activities outside that she enjoys and relationships from her past that she has let go during this difficult time.  Patient is to follow plans to maintain an emotional distance from her mother and sister as she goes home for her niece's birthday. Long-term goal: Resolve the core conflict that is the source of anxiety Short-term goal: Identify the major life complex from the past and present the form the basis for present anxiety  Stevphen Meuse, [facility]

## 2020-10-28 ENCOUNTER — Encounter: Payer: Self-pay | Admitting: Psychiatry

## 2020-10-28 ENCOUNTER — Other Ambulatory Visit: Payer: Self-pay

## 2020-10-28 ENCOUNTER — Ambulatory Visit (INDEPENDENT_AMBULATORY_CARE_PROVIDER_SITE_OTHER): Payer: BC Managed Care – PPO | Admitting: Psychiatry

## 2020-10-28 DIAGNOSIS — F3281 Premenstrual dysphoric disorder: Secondary | ICD-10-CM

## 2020-10-28 DIAGNOSIS — F902 Attention-deficit hyperactivity disorder, combined type: Secondary | ICD-10-CM

## 2020-10-28 MED ORDER — LISDEXAMFETAMINE DIMESYLATE 30 MG PO CAPS: 30.0000 mg | ORAL_CAPSULE | Freq: Every day | ORAL | 0 refills | Status: DC

## 2020-10-28 MED ORDER — OXCARBAZEPINE 300 MG PO TABS
300.0000 mg | ORAL_TABLET | Freq: Every day | ORAL | 1 refills | Status: DC
Start: 2020-10-28 — End: 2020-11-14

## 2020-10-28 NOTE — Progress Notes (Signed)
Tiffany Terry [id number] 05-12-1991 30 y.o.  Subjective:   Patient ID:  Tiffany Terry is a 30 y.o. (DOB 05-02-1991) female.  Chief Complaint:  Chief Complaint  Patient presents with  . Follow-up    ADD, h/o anxiety and mood disturbance    HPI Tiffany Terry presents to the office today for follow-up of ADHD. She reports that her insurance changed and had a 2-week lapse with Vyvanse.   She reports that she has had occasional "sadness out of nowhere." She reports that she also has some irritability at times. She reports that she can usually pinpoint what triggered change in mood and usually is able to re-frame and re-direct it. She reports that she typically is able to "let stuff go." She reports mood has been ok overall- "no huge swings... bad days." She reports that her anxiety has improved- "still there." Reports anxiety has been manageable. Sleeping well. Appetite has been ok. Energy and motivation have been good and notices an improvement in energy with warmer temperatures. She reports concentration is improved with Vyvanse. She is also better able to stay on task with Vyvanse. She reports that when she was without Vyvanse it was harder to stay on task. Denies SI.   Typically works an 8 hour day.  Past Psychiatric Medication Trials: Wellbutrin- Adverse reaction. Had dissociation and panic.  Sertraline- Ineffective, no side effects. No worsening anxiety, irritability, or insomnia Concerta Vyvanse- Effective- Minimally effective Adzenys XR Trileptal    PHQ2-9   Flowsheet Row Office Visit from 07/10/2014 in [facility]  PHQ-2 Total Score 0       Review of Systems:  Review of Systems  Cardiovascular: Negative for chest pain and palpitations.  Musculoskeletal: Negative for gait problem.  Neurological: Negative for tremors.  Psychiatric/Behavioral:       Please refer to HPI    Medications: I have reviewed the patient's current medications.  Current  Outpatient Medications  Medication Sig Dispense Refill  . albuterol (VENTOLIN HFA) 108 (90 Base) MCG/ACT inhaler Inhale into the lungs.    Melene Muller ON 11/25/2020] lisdexamfetamine (VYVANSE) 30 MG capsule Take 1 capsule (30 mg total) by mouth daily. 30 capsule 0  . lisdexamfetamine (VYVANSE) 30 MG capsule Take 1 capsule (30 mg total) by mouth daily. 30 capsule 0  . [START ON 12/23/2020] lisdexamfetamine (VYVANSE) 30 MG capsule Take 1 capsule (30 mg total) by mouth daily. 30 capsule 0  . Oxcarbazepine (TRILEPTAL) 300 MG tablet Take 1 tablet (300 mg total) by mouth at bedtime. 90 tablet 1   No current facility-administered medications for this visit.    Medication Side Effects: None  Allergies: No Known Allergies  Past Medical History:  Diagnosis Date  . Asthma     Family History  Problem Relation Age of Onset  . Healthy Mother   . Depression Mother   . Healthy Father   . Depression Father   . ADD / ADHD Father   . Bipolar disorder Sister   . Personality disorder Sister   . Depression Brother   . Anxiety disorder Brother   . Mood Disorder Paternal Uncle   . Alcohol abuse Maternal Grandfather     Social History   Socioeconomic History  . Marital status: Single    Spouse name: Not on file  . Number of children: Not on file  . Years of education: Not on file  . Highest education level: Not on file  Occupational History  . Not on file  Tobacco Use  . Smoking status: Current Every Day Smoker    Years: 11.00    Types: Cigarettes    Start date: 2010  . Smokeless tobacco: Never Used  . Tobacco comment: 3 cigarettes a day/smoked off and on  Vaping Use  . Vaping Use: Never used  Substance and Sexual Activity  . Alcohol use: Yes  . Drug use: Not Currently    Types: Marijuana    Comment: twice a month  . Sexual activity: Not on file  Other Topics Concern  . Not on file  Social History Narrative  . Not on file   Social Determinants of Health   Financial Resource  Strain: Not on file  Food Insecurity: Not on file  Transportation Needs: Not on file  Physical Activity: Not on file  Stress: Not on file  Social Connections: Not on file  Intimate Partner Violence: Not on file    Past Medical History, Surgical history, Social history, and Family history were reviewed and updated as appropriate.   Please see review of systems for further details on the patient's review from today.   Objective:   Physical Exam:  BP (!) 127/96   Pulse 74   Physical Exam Constitutional:      General: She is not in acute distress. Musculoskeletal:        General: No deformity.  Neurological:     Mental Status: She is alert and oriented to person, place, and time.     Coordination: Coordination normal.  Psychiatric:        Attention and Perception: Attention and perception normal. She does not perceive auditory or visual hallucinations.        Mood and Affect: Mood normal. Mood is not anxious or depressed. Affect is not labile, blunt, angry or inappropriate.        Speech: Speech normal.        Behavior: Behavior normal.        Thought Content: Thought content normal. Thought content is not paranoid or delusional. Thought content does not include homicidal or suicidal ideation. Thought content does not include homicidal or suicidal plan.        Cognition and Memory: Cognition and memory normal.        Judgment: Judgment normal.     Comments: Insight intact     Lab Review:     Component Value Date/Time   NA 140 07/24/2019 1841   K 3.9 07/24/2019 1841   CL 106 07/24/2019 1841   CO2 24 07/24/2019 1841   GLUCOSE 81 07/24/2019 1841   BUN 7 07/24/2019 1841   CREATININE 0.69 07/24/2019 1841   CALCIUM 9.5 07/24/2019 1841   PROT 7.3 07/24/2019 1841   ALBUMIN 4.2 07/24/2019 1841   AST 18 07/24/2019 1841   ALT 16 07/24/2019 1841   ALKPHOS 45 07/24/2019 1841   BILITOT 0.5 07/24/2019 1841   GFRNONAA >60 07/24/2019 1841   GFRAA >60 07/24/2019 1841        Component Value Date/Time   WBC 7.4 07/24/2019 1841   RBC 4.34 07/24/2019 1841   HGB 14.3 07/24/2019 1841   HCT 42.1 07/24/2019 1841   PLT 358 07/24/2019 1841   MCV 97.0 07/24/2019 1841   MCH 32.9 07/24/2019 1841   MCHC 34.0 07/24/2019 1841   RDW 12.5 07/24/2019 1841   LYMPHSABS 2.6 07/24/2019 1841   MONOABS 0.5 07/24/2019 1841   EOSABS 0.1 07/24/2019 1841   BASOSABS 0.1 07/24/2019 1841    No results found  for: POCLITH, LITHIUM   No results found for: PHENYTOIN, PHENOBARB, VALPROATE, CBMZ   .res Assessment: Plan:   Will continue current plan of care since target signs and symptoms are well controlled without any tolerability issues. Discussed that [employer] has had some limitations with coverage of Vyvanse in 2022 and Vyvanse could possibly require PA and/or be denied. Advised her to contact office if she has difficulty obtaining Vyvanse under new insurance. Briefly discussed alternatives if Vyvanse is not covered. She reports that Adzenys was not as effective. Reports that she typically works an 8 hour day and would prefer to have a longer-acting medication that would be effective throughout her work day.  Will continue Vyvanse 30 mg po q am for ADHD. Continue Trileptal 300 mg po QHS for mood stabilization and insomnia.  Recommend continuing therapy with Stevphen Meuse, [facility].  Pt to follow-up in 3 months or sooner if clinically indicated.  Patient advised to contact office with any questions, adverse effects, or acute worsening in signs and symptoms.  Tiffany Terry was seen today for follow-up.  Diagnoses and all orders for this visit:  Attention deficit hyperactivity disorder (ADHD), combined type -     lisdexamfetamine (VYVANSE) 30 MG capsule; Take 1 capsule (30 mg total) by mouth daily. -     lisdexamfetamine (VYVANSE) 30 MG capsule; Take 1 capsule (30 mg total) by mouth daily. -     lisdexamfetamine (VYVANSE) 30 MG capsule; Take 1 capsule (30 mg total) by mouth daily.  PMDD  (premenstrual dysphoric disorder) -     Oxcarbazepine (TRILEPTAL) 300 MG tablet; Take 1 tablet (300 mg total) by mouth at bedtime.     Please see After Visit Summary for patient specific instructions.  Future Appointments  Date Time Provider Department Center  10/31/2020  1:00 PM Stevphen Meuse, [facility] CP-CP None  11/14/2020  9:00 AM Stevphen Meuse, [facility] CP-CP None  11/28/2020  9:00 AM Stevphen Meuse, [facility] None  01/24/2021  9:45 AM Corie Chiquito, PMHNP CP-CP None    No orders of the defined types were placed in this encounter.   -------------------------------

## 2020-10-30 ENCOUNTER — Telehealth: Payer: Self-pay

## 2020-10-30 NOTE — Telephone Encounter (Signed)
Prior Authorization initiated for VYVANSE 30 MG with [employer] ID# [id number].  Pending response at this time.

## 2020-10-31 ENCOUNTER — Other Ambulatory Visit: Payer: Self-pay

## 2020-10-31 ENCOUNTER — Ambulatory Visit (INDEPENDENT_AMBULATORY_CARE_PROVIDER_SITE_OTHER): Payer: BC Managed Care – PPO | Admitting: Psychiatry

## 2020-10-31 DIAGNOSIS — F4323 Adjustment disorder with mixed anxiety and depressed mood: Secondary | ICD-10-CM | POA: Diagnosis not present

## 2020-10-31 NOTE — Progress Notes (Signed)
      [facility] Counselor/Therapist Progress Note  Patient ID: CATE ORAVEC, MRN: [id number],    Date: 10/31/2020  Time Spent: 48 minutes start time 1:11 PM end time 1:59 PM  Treatment Type: Individual Therapy  Reported Symptoms: anxiety, sadness, loneliness  Mental Status Exam:  Appearance:   Casual     Behavior:  Appropriate  Motor:  Normal  Speech/Language:   Normal Rate  Affect:  Appropriate  Mood:  normal  Thought process:  normal  Thought content:    WNL  Sensory/Perceptual disturbances:    WNL  Orientation:  oriented to person, place and time/date  Attention:  Good  Concentration:  Good  Memory:  WNL  Fund of knowledge:   Good  Insight:    Good  Judgment:   Good  Impulse Control:  Good   Risk Assessment: Danger to Self:  No Self-injurious Behavior: No Danger to Others: No Duty to Warn:no Physical Aggression / Violence:No  Access to Firearms a concern: No  Gang Involvement:No   Subjective: Patient was present for session.  She shared that she is feeling better overall.  She was able to go out and reconnect with some friends.  One of her friends is moving soon and that is creating some sadness.  She shared work is going to go better and she feels all of the emotions with the ending of the relationship are doing better.  She shared that the lonely feelings are decreasing some.  Patient went on to explain that her biggest issue currently is with her family.  She was able to go down and visit for her niece's birthday but the whole situation is still very overwhelming to her.  She shared more of the dynamics and the difficulty she has interacting with everybody while she is there.  Patient stated she recognizes there is nothing she can do to control and/or change her sister's behavior but it still very difficult to see her parents in the situation they are with having to raise her niece.  Patient was encouraged to focus on the things that she can do something about to set up  smaller visits with her family and see if they can meet her halfway so she can have more time with that her sister coming in and out.  Also encouraged her to continue reminding herself that she does not have to fix the situation she just needs to encourage her mother.  Interventions: Solution-Oriented/Positive Psychology  Diagnosis:   ICD-10-CM   1. Adjustment disorder with mixed anxiety and depressed mood  F43.23     Plan: Patient is to use CBT and coping skills to decrease anxiety symptoms.  Patient is to continue working on limit setting with her family.  Patient is to continue getting out and interacting more with others and taking time to get outside with her dog.   Stevphen Meuse, [facility]

## 2020-11-01 MED ORDER — AMPHETAMINE-DEXTROAMPHET ER 10 MG PO CP24: 10.0000 mg | ORAL_CAPSULE | Freq: Every day | ORAL | 0 refills | Status: DC

## 2020-11-01 NOTE — Telephone Encounter (Signed)
Determination received from [facility] for VYVANSE 30 MG coverage is DENIED, medication is not on the formulary and is covered when ALL alternative medications on formulary have been tried and failed.  Alternative medications include generic Adderall XR, generic Focalin XR, generic Dexedrine, Metadate CD, Ritalin LA  Will update Jessica with denial and how to proceed.

## 2020-11-01 NOTE — Telephone Encounter (Signed)
Will send script for Adderall XR 10 mg po qd since pt has tried Concerta, Vyvanse, and Adzenys.

## 2020-11-01 NOTE — Telephone Encounter (Signed)
Patient notified

## 2020-11-14 ENCOUNTER — Telehealth: Payer: Self-pay | Admitting: Psychiatry

## 2020-11-14 ENCOUNTER — Other Ambulatory Visit: Payer: Self-pay

## 2020-11-14 ENCOUNTER — Ambulatory Visit (INDEPENDENT_AMBULATORY_CARE_PROVIDER_SITE_OTHER): Payer: BC Managed Care – PPO | Admitting: Psychiatry

## 2020-11-14 DIAGNOSIS — F4323 Adjustment disorder with mixed anxiety and depressed mood: Secondary | ICD-10-CM

## 2020-11-14 DIAGNOSIS — F3281 Premenstrual dysphoric disorder: Secondary | ICD-10-CM

## 2020-11-14 MED ORDER — OXCARBAZEPINE 300 MG PO TABS: 450.0000 mg | ORAL_TABLET | Freq: Every day | ORAL | 1 refills | Status: DC

## 2020-11-14 NOTE — Telephone Encounter (Signed)
Recommend increasing Trileptal to 1.5 tabs at bedtime.

## 2020-11-14 NOTE — Progress Notes (Signed)
[facility] Counselor/Therapist Progress Note  Patient ID: RUBEN PYKA, MRN: [id number],    Date: 11/14/2020  Time Spent: 52 minutes start time 8:57 AM end time 9:49 AM  Treatment Type: Individual Therapy  Reported Symptoms: sadness, anxiety, crying spells, irritability, panic, disconnecting  Mental Status Exam:  Appearance:   Casual and Neat     Behavior:  Appropriate  Motor:  Normal  Speech/Language:   Normal Rate  Affect:  Appropriate  Mood:  sad  Thought process:  normal  Thought content:    WNL  Sensory/Perceptual disturbances:    WNL  Orientation:  oriented to person, place and time/date  Attention:  Good  Concentration:  Good  Memory:  WNL  Fund of knowledge:   Good  Insight:    Good  Judgment:   Good  Impulse Control:  Good   Risk Assessment: Danger to Self:  No Self-injurious Behavior: No Danger to Others: No Duty to Warn:no Physical Aggression / Violence:No  Access to Firearms a concern: No  Gang Involvement:No   Subjective: Patient was present for session.  She shared that she talked with an old friend that was close to her and her ex.  She stated that triggered some panic for her.  She also had a conversation with her current girlfriend that stated she has a tendency to assume things without communicating.  Her brother also had to move back here from [place] due to mental health issues. She explained that her dad asked her to reach out to brother, but she is trying to not over react because her father is very emotional in general. Had to switch from vyvanse to Adderall. Patient reported she feels that she is not as focused on the adderall.  Patient explained she is having some questions about the medication but knows she needs to deal with her situation currently.  At end of session she was able to move up her appointment with her provider Corie Chiquito, PMH, NP a month to discuss the other issue of irritability with her PMDD.  Patient was able to share  that she is concerned about several of the manic matters in her world but feels the biggest issue for her is concerning her ex.  Did EMDR on hearing that people are being told patient was emotionally cheating on her ex.  Suds level 8, negative cognition "I am a bad person" felt anxiety in her body.  Patient was able to reduce suds level to 3.  She was able to recognize that because she knows what really happened and what the truth is she does not have to worry about what other people think.  Patient also was able to recognize she needs to use her ex is behavior is confirmation that she made the right decision in the relationship.  Interventions: Solution-Oriented/Positive Psychology and Eye Movement Desensitization and Reprocessing (EMDR)  Diagnosis:   ICD-10-CM   1. Adjustment disorder with mixed anxiety and depressed mood  F43.23     Plan: Patient is to use CBT and coping skills to decrease anxiety symptoms.  Patient is to work on reframing comments from other people about her break-up as affirmation that she has make a good decision by moving on from the relationship.  Patient is to work on finding more things for her to keep her brain engaged in positive activity.  Patient is to see provider Corie Chiquito, PMH, NP sooner to discuss medication issues. Long-term goal: Resolve the core conflict that is  the source of anxiety Short-term goal: Identify the major life complex from the past and present the form the basis for present anxiety   Stevphen Meuse, [facility]

## 2020-11-14 NOTE — Telephone Encounter (Signed)
Pt was in the office today and said that the trileptal is not working and she would like to increase it, Please give her a call at [phone]

## 2020-11-14 NOTE — Telephone Encounter (Signed)
Pt has been informed.

## 2020-11-28 ENCOUNTER — Other Ambulatory Visit: Payer: Self-pay

## 2020-11-28 ENCOUNTER — Ambulatory Visit (INDEPENDENT_AMBULATORY_CARE_PROVIDER_SITE_OTHER): Payer: BC Managed Care – PPO | Admitting: Psychiatry

## 2020-11-28 DIAGNOSIS — F4323 Adjustment disorder with mixed anxiety and depressed mood: Secondary | ICD-10-CM | POA: Diagnosis not present

## 2020-11-28 NOTE — Progress Notes (Signed)
      [facility] Counselor/Therapist Progress Note  Patient ID: Tiffany Terry, MRN: [id number],    Date: 11/28/2020  Time Spent: 50 minutes start time 9:07 AM end time 9:57 PM  Treatment Type: Individual Therapy  Reported Symptoms: anxiety, frustration, panic  Mental Status Exam:  Appearance:   Well Groomed     Behavior:  Appropriate  Motor:  Normal  Speech/Language:   Normal Rate  Affect:  Appropriate  Mood:  normal  Thought process:  normal  Thought content:    WNL  Sensory/Perceptual disturbances:    WNL  Orientation:  oriented to person, place, time/date and situation  Attention:  Good  Concentration:  Good  Memory:  WNL  Fund of knowledge:   Good  Insight:    Good  Judgment:   Good  Impulse Control:  Good   Risk Assessment: Danger to Self:  No Self-injurious Behavior: No Danger to Others: No Duty to Warn:no Physical Aggression / Violence:No  Access to Firearms a concern: No  Gang Involvement:No   Subjective: Patient was present for session.  She shared that there are renovations at her work that are happening and she isn't being told until it happens.  She went on to share that she is starting to have anxiety over the 2nd job and the potential of forgetting something.  She has worked on Fridays and Saturdays. Now they are adding Thursdays. She stated it has been good for her socially, she is seeing people she hasn't seen in a while.  Patient shared that she felt some grief on Saturday since the drummer from her dad's favorite group for the past 25 year died suddenly. She shared that her dad came in to town and they were able to talk about it which was good because they were able to go to their concerts and bond together. He did go by her ex's hair salon and that was triggering.  Patient reported overall she is doing much better with things with her ex.  She was able to communicate with her dad about the awkwardness of the situation and he was able to acknowledge that he  did not necessarily need to see her anymore.  Patient shared that she is feeling better overall and feeling positive about the way things are going with her living situation and with her girlfriend.  Ways for patient to continue her progress were discussed in session.  It was agreed at this time that case would be put further out for next session since patient is making such great progress.  Interventions: Solution-Oriented/Positive Psychology  Diagnosis:   ICD-10-CM   1. Adjustment disorder with mixed anxiety and depressed mood  F43.23     Plan: Patient is to use CBT and coping skills to decrease anxiety symptoms.  Patient is to continue her second job to focus on putting money aside from more enjoyable activities.  Patient is to continue working on setting limits with her family.  Patient is to work on physical activity to release anxiety symptoms. Long term goal: Resolve the core conflict that is the source of anxiety Short term goal: Identify the major conflicts from the past and present that form the basis for present anxiety  Stevphen Meuse, [facility]

## 2020-12-25 ENCOUNTER — Encounter: Payer: Self-pay | Admitting: Psychiatry

## 2020-12-25 ENCOUNTER — Ambulatory Visit (INDEPENDENT_AMBULATORY_CARE_PROVIDER_SITE_OTHER): Payer: BC Managed Care – PPO | Admitting: Psychiatry

## 2020-12-25 ENCOUNTER — Other Ambulatory Visit: Payer: Self-pay

## 2020-12-25 VITALS — BP 137/90 | HR 62

## 2020-12-25 DIAGNOSIS — F3281 Premenstrual dysphoric disorder: Secondary | ICD-10-CM | POA: Diagnosis not present

## 2020-12-25 DIAGNOSIS — F902 Attention-deficit hyperactivity disorder, combined type: Secondary | ICD-10-CM

## 2020-12-25 MED ORDER — AMPHETAMINE-DEXTROAMPHET ER 10 MG PO CP24: 10.0000 mg | ORAL_CAPSULE | Freq: Every day | ORAL | 0 refills | Status: DC

## 2020-12-25 MED ORDER — AMPHETAMINE-DEXTROAMPHET ER 10 MG PO CP24
10.0000 mg | ORAL_CAPSULE | Freq: Every day | ORAL | 0 refills | Status: DC
Start: 2021-01-22 — End: 2021-05-07

## 2020-12-25 MED ORDER — OXCARBAZEPINE 300 MG PO TABS: 450.0000 mg | ORAL_TABLET | Freq: Every day | ORAL | 1 refills | Status: DC

## 2020-12-25 NOTE — Progress Notes (Signed)
Tiffany Terry [id number] 1991/02/03 30 y.o.  Subjective:   Patient ID:  Tiffany Terry is a 30 y.o. (DOB February 16, 1991) female.  Chief Complaint:  Chief Complaint  Patient presents with  . Follow-up    H/o ADHD, PMDD, and anxiety    HPI ANALISE GLOTFELTY presents to the office today for follow-up of ADHD, PMDD, and anxiety. She reports some increase in anxiety, depression, irritability s/s prior to onset of menses. She reports that increase in Trileptal seemed to be effective during last menstrual cycle. She noticed some s/s with last cycle, however s/s were not as severe and was able to identify it as menstrual related.   She reports that mood s/s have been good and continues to experience a full range of emotions to include appropriate range of emotions. Mood has been "a lot happier" since increase in medication. Anxiety has been "pretty good" and has been manageable. Sleeping ok. Occasionally feeling tired even after an adequate amount of sleep. Energy and motivation have been good. Appetite has been ok and notices some slight decrease in appetite. She reports that Adderall XR has been effective for concentration. She had to stop drinking coffee when she started Adderall XR. She reports that Adderall XR seems to have about an 8 hour duration. Denies any risky behavior. She has had some impulsive spending since running out of Adderall XR and has spent money on things like food and drink. Denies SI.   Work has slowed down to a manageable pace. She started a second job in the evenings and weekends and may quit this.   Past Psychiatric Medication Trials: Wellbutrin- Adverse reaction. Had dissociation and panic.  Sertraline- Ineffective, no side effects. No worsening anxiety, irritability, or insomnia Concerta Vyvanse- Effective- Minimally effective Adzenys XR Adderall XR Trileptal  PHQ2-9   Flowsheet Row Office Visit from 07/10/2014 in [facility]  PHQ-2 Total Score 0        Review of Systems:  Review of Systems  Cardiovascular: Negative for palpitations.  Musculoskeletal: Negative for gait problem.  Allergic/Immunologic: Positive for environmental allergies.  Psychiatric/Behavioral:       Please refer to HPI    Medications: I have reviewed the patient's current medications.  Current Outpatient Medications  Medication Sig Dispense Refill  . albuterol (VENTOLIN HFA) 108 (90 Base) MCG/ACT inhaler Inhale into the lungs.    Tiffany Terry ON 01/22/2021] amphetamine-dextroamphetamine (ADDERALL XR) 10 MG 24 hr capsule Take 1 capsule (10 mg total) by mouth daily. 30 capsule 0  . [START ON 02/19/2021] amphetamine-dextroamphetamine (ADDERALL XR) 10 MG 24 hr capsule Take 1 capsule (10 mg total) by mouth daily. 30 capsule 0  . cetirizine (ZYRTEC) 10 MG tablet Take 10 mg by mouth daily.    Marland Kitchen amphetamine-dextroamphetamine (ADDERALL XR) 10 MG 24 hr capsule Take 1 capsule (10 mg total) by mouth daily. 30 capsule 0  . Oxcarbazepine (TRILEPTAL) 300 MG tablet Take 1.5 tablets (450 mg total) by mouth at bedtime. 135 tablet 1   No current facility-administered medications for this visit.    Medication Side Effects: Other: Some decrease in appetite with Adderall XR  Allergies: No Known Allergies  Past Medical History:  Diagnosis Date  . Asthma     Past Medical History, Surgical history, Social history, and Family history were reviewed and updated as appropriate.   Please see review of systems for further details on the patient's review from today.   Objective:   Physical Exam:  BP 137/90  Pulse 62   Physical Exam Constitutional:      General: She is not in acute distress. Musculoskeletal:        General: No deformity.  Neurological:     Mental Status: She is alert and oriented to person, place, and time.     Coordination: Coordination normal.  Psychiatric:        Attention and Perception: Attention and perception normal. She does not perceive auditory or  visual hallucinations.        Mood and Affect: Mood normal. Mood is not anxious or depressed. Affect is not labile, blunt, angry or inappropriate.        Speech: Speech normal.        Behavior: Behavior normal.        Thought Content: Thought content normal. Thought content is not paranoid or delusional. Thought content does not include homicidal or suicidal ideation. Thought content does not include homicidal or suicidal plan.        Cognition and Memory: Cognition and memory normal.        Judgment: Judgment normal.     Comments: Insight intact     Lab Review:     Component Value Date/Time   NA 140 07/24/2019 1841   K 3.9 07/24/2019 1841   CL 106 07/24/2019 1841   CO2 24 07/24/2019 1841   GLUCOSE 81 07/24/2019 1841   BUN 7 07/24/2019 1841   CREATININE 0.69 07/24/2019 1841   CALCIUM 9.5 07/24/2019 1841   PROT 7.3 07/24/2019 1841   ALBUMIN 4.2 07/24/2019 1841   AST 18 07/24/2019 1841   ALT 16 07/24/2019 1841   ALKPHOS 45 07/24/2019 1841   BILITOT 0.5 07/24/2019 1841   GFRNONAA >60 07/24/2019 1841   GFRAA >60 07/24/2019 1841       Component Value Date/Time   WBC 7.4 07/24/2019 1841   RBC 4.34 07/24/2019 1841   HGB 14.3 07/24/2019 1841   HCT 42.1 07/24/2019 1841   PLT 358 07/24/2019 1841   MCV 97.0 07/24/2019 1841   MCH 32.9 07/24/2019 1841   MCHC 34.0 07/24/2019 1841   RDW 12.5 07/24/2019 1841   LYMPHSABS 2.6 07/24/2019 1841   MONOABS 0.5 07/24/2019 1841   EOSABS 0.1 07/24/2019 1841   BASOSABS 0.1 07/24/2019 1841    No results found for: POCLITH, LITHIUM   No results found for: PHENYTOIN, PHENOBARB, VALPROATE, CBMZ   .res Assessment: Plan:   Will continue current plan of care since target signs and symptoms are well controlled without any tolerability issues. Will continue Trileptal 450 mg at bedtime for mood and anxiety s/s.  Continue Adderall XR 10 mg daily for ADHD.  Pt to follow-up in 3 months or sooner if clinically indicated.  Recommend continuing  therapy with Stevphen Meuse, [facility].  Patient advised to contact office with any questions, adverse effects, or acute worsening in signs and symptoms.  Katherine was seen today for follow-up.  Diagnoses and all orders for this visit:  Attention deficit hyperactivity disorder (ADHD), combined type -     amphetamine-dextroamphetamine (ADDERALL XR) 10 MG 24 hr capsule; Take 1 capsule (10 mg total) by mouth daily. -     amphetamine-dextroamphetamine (ADDERALL XR) 10 MG 24 hr capsule; Take 1 capsule (10 mg total) by mouth daily. -     amphetamine-dextroamphetamine (ADDERALL XR) 10 MG 24 hr capsule; Take 1 capsule (10 mg total) by mouth daily.  PMDD (premenstrual dysphoric disorder) -     Oxcarbazepine (TRILEPTAL) 300 MG tablet; Take 1.5  tablets (450 mg total) by mouth at bedtime.     Please see After Visit Summary for patient specific instructions.  Future Appointments  Date Time Provider Department Center  12/26/2020  9:00 AM Stevphen Meuse, [facility] CP-[facility] None  01/23/2021  1:00 PM Stevphen Meuse, [facility] CP-CP None  03/26/2021  9:00 AM Corie Chiquito, [facility] CP-CP None    No orders of the defined types were placed in this encounter.   -------------------------------

## 2020-12-26 ENCOUNTER — Ambulatory Visit (INDEPENDENT_AMBULATORY_CARE_PROVIDER_SITE_OTHER): Payer: BC Managed Care – PPO | Admitting: Psychiatry

## 2020-12-26 DIAGNOSIS — F4323 Adjustment disorder with mixed anxiety and depressed mood: Secondary | ICD-10-CM

## 2020-12-26 NOTE — Progress Notes (Signed)
      [facility] Counselor/Therapist Progress Note  Patient ID: Tiffany Terry, MRN: [id number],    Date: 12/26/2020  Time Spent: 50 minutes start time 9:09 AM and time 9:59 AM  Treatment Type: Individual Therapy  Reported Symptoms: anxiety, frustration, focusing issues  Mental Status Exam:  Appearance:   Casual     Behavior:  Appropriate  Motor:  Normal  Speech/Language:   Normal Rate  Affect:  Appropriate  Mood:  normal  Thought process:  normal  Thought content:    WNL  Sensory/Perceptual disturbances:    WNL  Orientation:  oriented to person, place, time/date and situation  Attention:  Good  Concentration:  Good  Memory:  WNL  Fund of knowledge:   Good  Insight:    Good  Judgment:   Good  Impulse Control:  Good   Risk Assessment: Danger to Self:  No Self-injurious Behavior: No Danger to Others: No Duty to Warn:no Physical Aggression / Violence:No  Access to Firearms a concern: No  Gang Involvement:No   Subjective: Patient was present for session.  She shared the increase in medication has helped her PMDD and she felt her mood was manageable.  She went on to share that there have been more break up issues that have surfaced which brought up some issues but she is handling the situation.  Patient was able to explain a situation at work that involved her ex.  She had already figured out how she wanted to handle the situation but discussed it with clinician and was encouraged to follow through with plans that she had already developed.  Patient is struggling with her second job due to it giving her lots of downtime.  Discussed how that much downtime can allow for negative thoughts and difficulties if she is not proactive about handling that.  She shared that she is finding it very hard for her to stay and be still and keep her thoughts moving in a good direction.  Patient was able to realize that the job is not a good fit for her, discussed a plan for her to communicate that  with her boss and to figure out what may be another option for her.  Patient shared she is having difficulty with budgeting.  Discussed a couple different options to help her stay more accountable with finances and on top of her spending.  Patient agreed to try the different options and see what she is more effective for her.  Interventions: Cognitive Behavioral Therapy and Solution-Oriented/Positive Psychology  Diagnosis:   ICD-10-CM   1. Adjustment disorder with mixed anxiety and depressed mood  F43.23     Plan: Patient is to use CBT and coping skills to decrease anxiety symptoms.  Patient is to follow through on plans to manage money differently and figure out what to do concerning her second job.  Is to continue releasing negative emotions through exercise.  Patient is to take medication as directed Long term goal: Resolve the core conflict that is the source of anxiety Short term goal: Identify the major conflicts from the past and present that form the basis for present anxiety   Stevphen Meuse, [facility]

## 2021-01-23 ENCOUNTER — Other Ambulatory Visit: Payer: Self-pay

## 2021-01-23 ENCOUNTER — Ambulatory Visit (INDEPENDENT_AMBULATORY_CARE_PROVIDER_SITE_OTHER): Payer: BC Managed Care – PPO | Admitting: Psychiatry

## 2021-01-23 DIAGNOSIS — F4323 Adjustment disorder with mixed anxiety and depressed mood: Secondary | ICD-10-CM | POA: Diagnosis not present

## 2021-01-23 NOTE — Progress Notes (Signed)
[facility] Counselor/Therapist Progress Note  Patient ID: Tiffany Terry, MRN: [id number],    Date: 01/23/2021  Time Spent: 51 minutes start time 1:03 PM end time 1:54 PM  Treatment Type: Individual Therapy  Reported Symptoms: anxiety, focusing issues  Mental Status Exam:  Appearance:   Casual and Neat     Behavior:  Appropriate  Motor:  Normal  Speech/Language:   Normal Rate  Affect:  Appropriate  Mood:  normal  Thought process:  normal  Thought content:    WNL  Sensory/Perceptual disturbances:    WNL  Orientation:  oriented to person, place, time/date and situation  Attention:  Good  Concentration:  Good  Memory:  WNL  Fund of knowledge:   Good  Insight:    Good  Judgment:   Good  Impulse Control:  Good   Risk Assessment: Danger to Self:  No Self-injurious Behavior: No Danger to Others: No Duty to Warn:no Physical Aggression / Violence:No  Access to Firearms a concern: No  Gang Involvement:No   Subjective: Patient was present for session.  She stated that work things are going okay overall.  She notices some focusing issues at times but she seems to be doing okay. She shared she is noticing issues with her daily routine.  She gets into patterns but than stops them and it repeats. She shared that overall she is doing better overall.  She explained her [place] Ouch went well.  She did not get the time with her parents due to her niece coming but it was fine.  Vacation is next week so she is excited but stressed.  Patient reported that she felt the thing that was bringing the most anxiety for her was not being in a routine and structure to make sure she is at work on time and getting what she needs to completed.  Patient was allowed time to process what was currently going on in her life and what her daily routine seemed to be.  She explained that she spends some nights with her girlfriend and some nights she does not so every day has seemed to be different.  Patient stated  she spends anywhere from 4-6 nights a week at her girlfriend's house.  Patient was encouraged to think through times where she could organize for the week or set routines even with going to her girlfriend's house as much as she is.  Patient was able to realize that her girlfriend works every Saturday so she could use Saturdays to let her medications for the week pick at her clothes for the week and structure some with her eating.  She also shared that sometimes her girlfriend gets up at 7 and she notices on those days it is easier for her to do her exercising which makes a huge difference in her functioning.  Encourage patient to tell herself she does not go over to her girlfriends until 7 every night to help her exercise daily and stay into routine so it can become a habit.  Was also encouraged to figure out how to make her apartment more appealing so that they can spend some nights with her dog at her apartment.  Patient is also to set goals up for herself at work and tell herself she cannot scream full on her phone until the things are completed.  Patient shared she can listen to podcast and that helps with her ADD when she is at work.  Encouraged her to pick out the podcast for  the next day the night prior to session so she does not find herself scrolling without a goal.  Reported feeling positive about plans from session and hopeful that that will get a routine in place for her.    Interventions: Cognitive Behavioral Therapy and Solution-Oriented/Positive Psychology  Diagnosis:   ICD-10-CM   1. Adjustment disorder with mixed anxiety and depressed mood  F43.23     Plan: Patient is to use CBT and coping skills to decrease anxiety symptoms.  Patient is to follow plans from session to set up a structure in the routine into her daily life to improve functioning and decrease anxiety.  Patient is to exercise daily to release negative emotions appropriately.  Patient is to take medication as  directed. Long-term goal: Resolve the core conflict that is a source of anxiety Short-term goal: Identify the major life complex in the past and present the form the basis for present anxiety  Stevphen Meuse, [facility]

## 2021-01-24 ENCOUNTER — Ambulatory Visit: Payer: BC Managed Care – PPO | Admitting: Psychiatry

## 2021-02-13 ENCOUNTER — Ambulatory Visit: Payer: BC Managed Care – PPO | Admitting: Psychiatry

## 2021-03-13 ENCOUNTER — Ambulatory Visit: Payer: BC Managed Care – PPO | Admitting: Psychiatry

## 2021-03-26 ENCOUNTER — Ambulatory Visit: Payer: BC Managed Care – PPO | Admitting: Psychiatry

## 2021-05-07 ENCOUNTER — Encounter: Payer: Self-pay | Admitting: Psychiatry

## 2021-05-07 ENCOUNTER — Ambulatory Visit (INDEPENDENT_AMBULATORY_CARE_PROVIDER_SITE_OTHER): Payer: BC Managed Care – PPO | Admitting: Psychiatry

## 2021-05-07 ENCOUNTER — Other Ambulatory Visit: Payer: Self-pay

## 2021-05-07 DIAGNOSIS — F3281 Premenstrual dysphoric disorder: Secondary | ICD-10-CM | POA: Diagnosis not present

## 2021-05-07 DIAGNOSIS — F902 Attention-deficit hyperactivity disorder, combined type: Secondary | ICD-10-CM

## 2021-05-07 MED ORDER — OXCARBAZEPINE 300 MG PO TABS: 450.0000 mg | ORAL_TABLET | Freq: Every day | ORAL | 1 refills | Status: DC

## 2021-05-07 MED ORDER — AMPHETAMINE-DEXTROAMPHET ER 10 MG PO CP24: 10.0000 mg | ORAL_CAPSULE | Freq: Every day | ORAL | 0 refills | Status: DC

## 2021-05-07 NOTE — Progress Notes (Signed)
Tiffany Terry [id number] [birth year] 30 y.o.  Subjective:   Patient ID:  Tiffany Terry is a 30 y.o. (DOB Oct 15, 1990) female.  Chief Complaint:  Chief Complaint  Patient presents with   Anxiety   ADHD   Other    Mood disturbance    HPI Tiffany Terry presents to the office today for follow-up of mood disturbance, anxiety, ADHD, and insomnia. Reports increased stress in general and this causes increased anxiety. Describes feeling, "I don't know what to do or how to make it stop." She reports feeling overwhelmed. She reports irritation at times with anxiety. She reports that she has had some panic attacks or "heightened states" where breathing is abnormal. Reports that she had a sensation one day that she was weighted down "like sandbags on my shoulders." Reports some worry and anxious thoughts with trying to problem solve. Reports some catastrophic thoughts.   Mood has been "kind of up and down." Has "cut way back on drinking... no more than 2 drinks a day" for the last few weeks and reports that this is a 1/4 of what she has drank at times in the past. Noticed increased irritability with ETOH use. Reports "felt better" within a few days of reducing ETOH use. Reports that sleep has improved with decreased ETOH use. Reports some friends drink heavily and is evaluating this. Energy has been "pretty good, motivation has been ok." Describes concentration as "hit or miss." Reports some days concentration is poor some days and other days is able to focus and be productive. Appetite has been good. Has been intentional about trying to eat breakfast and lunch. Denies impulsive or risky behavior. Denies SI.   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   Has been trying to develop activities outside of work.   Has been having communication difficulties with partner.   Takes Adderall every day except for Sunday.   Past Psychiatric Medication  Trials: Wellbutrin- Adverse reaction. Had dissociation and panic.  Sertraline- Ineffective, no side effects. No worsening anxiety, irritability, or insomnia Concerta Vyvanse- Effective- Minimally effective Adzenys XR Adderall XR Trileptal   PHQ2-9    Flowsheet Row Office Visit from 07/10/2014 in [facility]  PHQ-2 Total Score 0        Review of Systems:  Review of Systems  Cardiovascular:  Negative for palpitations.  Musculoskeletal:  Negative for gait problem.  Psychiatric/Behavioral:         Please refer to HPI   Medications: I have reviewed the patient's current medications.  Current Outpatient Medications  Medication Sig Dispense Refill   cetirizine (ZYRTEC) 10 MG tablet Take 10 mg by mouth daily as needed.     albuterol (VENTOLIN HFA) 108 (90 Base) MCG/ACT inhaler Inhale into the lungs.     amphetamine-dextroamphetamine (ADDERALL XR) 10 MG 24 hr capsule Take 1 capsule (10 mg total) by mouth daily. 30 capsule 0   amphetamine-dextroamphetamine (ADDERALL XR) 10 MG 24 hr capsule Take 1 capsule (10 mg total) by mouth daily. 30 capsule 0   [START ON 06/04/2021] amphetamine-dextroamphetamine (ADDERALL XR) 10 MG 24 hr capsule Take 1 capsule (10 mg total) by mouth daily. 30 capsule 0   Oxcarbazepine (TRILEPTAL) 300 MG tablet Take 1.5 tablets (450 mg total) by mouth at bedtime. 135 tablet 1   No current facility-administered medications for this visit.    Medication Side Effects: Appetite Suppression  Allergies: No Known Allergies  Past Medical History:  Diagnosis Date   Asthma     Past Medical History, Surgical history, Social history, and Family history were reviewed and updated as appropriate.   Please see review of systems for further details on the patient's review from today.   Objective:   Physical Exam:  BP 133/86   Pulse 64   Physical Exam Constitutional:      General: She is not in acute distress. Musculoskeletal:        General:  No deformity.  Neurological:     Mental Status: She is alert and oriented to person, place, and time.     Coordination: Coordination normal.  Psychiatric:        Attention and Perception: Attention and perception normal. She does not perceive auditory or visual hallucinations.        Mood and Affect: Mood is anxious. Mood is not depressed. Affect is not labile, blunt, angry or inappropriate.        Speech: Speech normal.        Behavior: Behavior normal.        Thought Content: Thought content normal. Thought content is not paranoid or delusional. Thought content does not include homicidal or suicidal ideation. Thought content does not include homicidal or suicidal plan.        Cognition and Memory: Cognition and memory normal.        Judgment: Judgment normal.     Comments: Insight intact    Lab Review:     Component Value Date/Time   NA 140 07/24/2019 1841   K 3.9 07/24/2019 1841   CL 106 07/24/2019 1841   CO2 24 07/24/2019 1841   GLUCOSE 81 07/24/2019 1841   BUN 7 07/24/2019 1841   CREATININE 0.69 07/24/2019 1841   CALCIUM 9.5 07/24/2019 1841   PROT 7.3 07/24/2019 1841   ALBUMIN 4.2 07/24/2019 1841   AST 18 07/24/2019 1841   ALT 16 07/24/2019 1841   ALKPHOS 45 07/24/2019 1841   BILITOT 0.5 07/24/2019 1841   GFRNONAA >60 07/24/2019 1841   GFRAA >60 07/24/2019 1841       Component Value Date/Time   WBC 7.4 07/24/2019 1841   RBC 4.34 07/24/2019 1841   HGB 14.3 07/24/2019 1841   HCT 42.1 07/24/2019 1841   PLT 358 07/24/2019 1841   MCV 97.0 07/24/2019 1841   MCH 32.9 07/24/2019 1841   MCHC 34.0 07/24/2019 1841   RDW 12.5 07/24/2019 1841   LYMPHSABS 2.6 07/24/2019 1841   MONOABS 0.5 07/24/2019 1841   EOSABS 0.1 07/24/2019 1841   BASOSABS 0.1 07/24/2019 1841    No results found for: POCLITH, LITHIUM   No results found for: PHENYTOIN, PHENOBARB, VALPROATE, CBMZ   .res Assessment: Plan:   Patient seen for 30 minutes and time spent counseling patient regarding mood  and anxiety signs and symptoms.  Agreed that reducing alcohol use will likely continue to have a positive effect on mood and anxiety.  Discussed option of either continuing current medications or considering adding medication to improve anxiety. Discussed potential benefits, risks, and side effects of Buspar.  She reports that she would prefer to continue current medications without changes at this time to be able to fully assess changes in mood and anxiety with decreasing alcohol use.  Discussed options to track mood and she reports that she will likely use a mood app on her phone.  Advised that BuSpar could be started if she experiences worsening anxiety or decides that she would like  to start medication. Continue Adderall XR 10 mg daily for attention deficit disorder. Continue Trileptal 450 mg at bedtime for mood and anxiety signs and symptoms. Pt to follow-up in 3 months or sooner if clinically indicated.  Recommend continuing therapy with Stevphen Meuse, [facility].  Patient advised to contact office with any questions, adverse effects, or acute worsening in signs and symptoms.   Tiffany Terry was seen today for anxiety, adhd and other.  Diagnoses and all orders for this visit:  Attention deficit hyperactivity disorder (ADHD), combined type -     amphetamine-dextroamphetamine (ADDERALL XR) 10 MG 24 hr capsule; Take 1 capsule (10 mg total) by mouth daily. -     amphetamine-dextroamphetamine (ADDERALL XR) 10 MG 24 hr capsule; Take 1 capsule (10 mg total) by mouth daily.  PMDD (premenstrual dysphoric disorder) -     Oxcarbazepine (TRILEPTAL) 300 MG tablet; Take 1.5 tablets (450 mg total) by mouth at bedtime.    Please see After Visit Summary for patient specific instructions.  Future Appointments  Date Time Provider Department Center  06/03/2021 12:00 PM Stevphen Meuse, [facility] CP[facility] None  07/09/2021  9:00 AM Corie Chiquito, [facility] CP-CP None    No orders of the defined types were placed in this  encounter.   -------------------------------

## 2021-06-03 ENCOUNTER — Ambulatory Visit (INDEPENDENT_AMBULATORY_CARE_PROVIDER_SITE_OTHER): Payer: BC Managed Care – PPO | Admitting: Psychiatry

## 2021-06-03 ENCOUNTER — Other Ambulatory Visit: Payer: Self-pay

## 2021-06-03 DIAGNOSIS — F4323 Adjustment disorder with mixed anxiety and depressed mood: Secondary | ICD-10-CM | POA: Diagnosis not present

## 2021-06-03 NOTE — Progress Notes (Signed)
      [facility] Counselor/Therapist Progress Note  Patient ID: Tiffany Terry, MRN: [id number],    Date: 06/03/2021  Time Spent: 49 minutes start time 12:11 PM end time 1:00 PM  Treatment Type: Individual Therapy  Reported Symptoms: sadness, anxiety, focusing issues  Mental Status Exam:  Appearance:   Casual and Neat     Behavior:  Appropriate  Motor:  Normal  Speech/Language:   Normal Rate  Affect:  Appropriate  Mood:  anxious  Thought process:  normal  Thought content:    WNL  Sensory/Perceptual disturbances:    WNL  Orientation:  oriented to person, place, time/date, and situation  Attention:  Good  Concentration:  Good  Memory:  WNL  Fund of knowledge:   Good  Insight:    Good  Judgment:   Good  Impulse Control:  Good   Risk Assessment: Danger to Self:  No Self-injurious Behavior: No Danger to Others: No Duty to Warn:no Physical Aggression / Violence:No  Access to Firearms a concern: No  Gang Involvement:No   Subjective: Patient was present for session.  She shared she had a period when her mood was not good.  She was able to reflect and recognize she needed to focus more on her self care.  Patient explained that she was realizing some of her choices were not positive and were impacting her negatively.  Patient has been able to change some of those decisions and reported that her mood is better.  She shared she is having anxiety about an upcoming trip with her family.  She explained that her niece will be going and when she tried to babysit her it was extremely stressful and overwhelming.  Patient was able to process through some different plans to try and help take care of herself and balance having time with her family.  Patient was also encouraged to recognize that she has to make decisions that are going to work for her and keep focused on balancing enjoying friends as well as taking care of herself.  Interventions: Solution-Oriented/Positive Psychology and  Insight-Oriented  Diagnosis:   ICD-10-CM   1. Adjustment disorder with mixed anxiety and depressed mood  F43.23       Plan: Patient is to use CBT and coping skills to decrease anxiety symptoms.  Patient is to follow plans from session to get through her trip appropriately.  Patient is to focus on self-care to help make positive decisions that will help her feel better.  Patient is to take medication as directed. Long-term goal: Resolve the core conflict that is a source of anxiety Short-term goal: Identify the major life complex in the past and present the form the basis for present anxiety  Stevphen Meuse, [facility]

## 2021-07-09 ENCOUNTER — Other Ambulatory Visit: Payer: Self-pay

## 2021-07-09 ENCOUNTER — Encounter: Payer: Self-pay | Admitting: Psychiatry

## 2021-07-09 ENCOUNTER — Ambulatory Visit (INDEPENDENT_AMBULATORY_CARE_PROVIDER_SITE_OTHER): Payer: BC Managed Care – PPO | Admitting: Psychiatry

## 2021-07-09 VITALS — BP 121/81 | HR 63

## 2021-07-09 DIAGNOSIS — F902 Attention-deficit hyperactivity disorder, combined type: Secondary | ICD-10-CM

## 2021-07-09 DIAGNOSIS — F4323 Adjustment disorder with mixed anxiety and depressed mood: Secondary | ICD-10-CM | POA: Diagnosis not present

## 2021-07-09 MED ORDER — AMPHETAMINE-DEXTROAMPHET ER 10 MG PO CP24: 10.0000 mg | ORAL_CAPSULE | Freq: Every day | ORAL | 0 refills | Status: DC

## 2021-07-09 MED ORDER — BUSPIRONE HCL 15 MG PO TABS: ORAL_TABLET | ORAL | 1 refills | Status: DC

## 2021-07-09 NOTE — Progress Notes (Signed)
Tiffany Terry [id number] 1991/04/26 30 y.o.  Subjective:   Patient ID:  Tiffany Terry is a 30 y.o. (DOB 10-20-1990) female.  Chief Complaint:  Chief Complaint  Patient presents with   Anxiety   Follow-up    Mood disturbance    HPI Tiffany Terry presents to the office today for follow-up of anxiety and mood disturbance. "In the middle." Reduced ETOH use and "of course it did help" and then ETOH use increased again. Reports there is a cycle with "good habits" of making improvements and then falls back into previous cycle.   Reports anxiety has been "ok, comes in flashes." Has anxiety around previous relationship and how this ended. Reports that this anxiety has improved some over time and has anxiety when she sees this person who lives and works in the same area. Occ intrusive thoughts about "everyone dying." Worry has been "ok." Has been trying to either handle problems or ask for help instead of worrying. Denies any physical s/s of anxiety.   Mood has been "fine, pretty neutral." Does not recall any recent depressive episodes. Denies elevated moods, impulsivity, or risky behavior. Sleeping well. Waking up earlier and prefers this. Appetite is ok. Energy and motivation have been ok. Concentration is adequate overall at work. Denies SI.   Work has been ok.   Past Psychiatric Medication Trials: Wellbutrin- Adverse reaction. Had dissociation and panic.  Sertraline- Ineffective, no side effects. No worsening anxiety, irritability, or insomnia Concerta Vyvanse- Effective- Minimally effective Adzenys XR Adderall XR Trileptal   PHQ2-9    Flowsheet Row Office Visit from 07/10/2014 in [facility]  PHQ-2 Total Score 0        Review of Systems:  Review of Systems  Cardiovascular:  Negative for palpitations.  Musculoskeletal:  Negative for gait problem.  Neurological:  Negative for tremors.  Psychiatric/Behavioral:         Please refer to HPI   Medications: I have  reviewed the patient's current medications.  Current Outpatient Medications  Medication Sig Dispense Refill   busPIRone (BUSPAR) 15 MG tablet Take 1/3 tablet p.o. twice daily for 1 week, then take 2/3 tablet p.o. twice daily for 1 week, then take 1 tablet p.o. twice daily 60 tablet 1   albuterol (VENTOLIN HFA) 108 (90 Base) MCG/ACT inhaler Inhale into the lungs.     [START ON 09/18/2021] amphetamine-dextroamphetamine (ADDERALL XR) 10 MG 24 hr capsule Take 1 capsule (10 mg total) by mouth daily. 30 capsule 0   [START ON 08/21/2021] amphetamine-dextroamphetamine (ADDERALL XR) 10 MG 24 hr capsule Take 1 capsule (10 mg total) by mouth daily. 30 capsule 0   [START ON 07/24/2021] amphetamine-dextroamphetamine (ADDERALL XR) 10 MG 24 hr capsule Take 1 capsule (10 mg total) by mouth daily. 30 capsule 0   cetirizine (ZYRTEC) 10 MG tablet Take 10 mg by mouth daily as needed.     Oxcarbazepine (TRILEPTAL) 300 MG tablet Take 1.5 tablets (450 mg total) by mouth at bedtime. 135 tablet 1   No current facility-administered medications for this visit.    Medication Side Effects: None  Allergies: No Known Allergies  Past Medical History:  Diagnosis Date   Asthma     Past Medical History, Surgical history, Social history, and Family history were reviewed and updated as appropriate.   Please see review of systems for further details on the patient's review from today.   Objective:   Physical Exam:  BP 121/81   Pulse 63   Physical  Exam Constitutional:      General: She is not in acute distress. Musculoskeletal:        General: No deformity.  Neurological:     Mental Status: She is alert and oriented to person, place, and time.     Coordination: Coordination normal.  Psychiatric:        Attention and Perception: Attention and perception normal. She does not perceive auditory or visual hallucinations.        Mood and Affect: Mood is anxious. Mood is not depressed. Affect is not labile, blunt,  angry or inappropriate.        Speech: Speech normal.        Behavior: Behavior normal.        Thought Content: Thought content normal. Thought content is not paranoid or delusional. Thought content does not include homicidal or suicidal ideation. Thought content does not include homicidal or suicidal plan.        Cognition and Memory: Cognition and memory normal.        Judgment: Judgment normal.     Comments: Insight intact    Lab Review:     Component Value Date/Time   NA 140 07/24/2019 1841   K 3.9 07/24/2019 1841   CL 106 07/24/2019 1841   CO2 24 07/24/2019 1841   GLUCOSE 81 07/24/2019 1841   BUN 7 07/24/2019 1841   CREATININE 0.69 07/24/2019 1841   CALCIUM 9.5 07/24/2019 1841   PROT 7.3 07/24/2019 1841   ALBUMIN 4.2 07/24/2019 1841   AST 18 07/24/2019 1841   ALT 16 07/24/2019 1841   ALKPHOS 45 07/24/2019 1841   BILITOT 0.5 07/24/2019 1841   GFRNONAA >60 07/24/2019 1841   GFRAA >60 07/24/2019 1841       Component Value Date/Time   WBC 7.4 07/24/2019 1841   RBC 4.34 07/24/2019 1841   HGB 14.3 07/24/2019 1841   HCT 42.1 07/24/2019 1841   PLT 358 07/24/2019 1841   MCV 97.0 07/24/2019 1841   MCH 32.9 07/24/2019 1841   MCHC 34.0 07/24/2019 1841   RDW 12.5 07/24/2019 1841   LYMPHSABS 2.6 07/24/2019 1841   MONOABS 0.5 07/24/2019 1841   EOSABS 0.1 07/24/2019 1841   BASOSABS 0.1 07/24/2019 1841    No results found for: POCLITH, LITHIUM   No results found for: PHENYTOIN, PHENOBARB, VALPROATE, CBMZ   .res Assessment: Plan:    Discussed potential benefits, risks, and side effects of BuSpar.  Patient agrees to trial of BuSpar.  Will start BuSpar 15 mg 1/3 tablet twice daily for 1 week, then increase to 2/3 tablet twice daily for 1 week, then increase to 1 tablet twice daily for anxiety. Continue Adderall XR 10 mg po qd for ADHD.  Pt to follow-up in 3 months or sooner if clinically indicated.  Patient advised to contact office with any questions, adverse effects, or  acute worsening in signs and symptoms.   Tiffany Terry was seen today for anxiety and follow-up.  Diagnoses and all orders for this visit:  Adjustment disorder with mixed anxiety and depressed mood -     busPIRone (BUSPAR) 15 MG tablet; Take 1/3 tablet p.o. twice daily for 1 week, then take 2/3 tablet p.o. twice daily for 1 week, then take 1 tablet p.o. twice daily  Attention deficit hyperactivity disorder (ADHD), combined type -     amphetamine-dextroamphetamine (ADDERALL XR) 10 MG 24 hr capsule; Take 1 capsule (10 mg total) by mouth daily. -     amphetamine-dextroamphetamine (ADDERALL XR) 10  MG 24 hr capsule; Take 1 capsule (10 mg total) by mouth daily. -     amphetamine-dextroamphetamine (ADDERALL XR) 10 MG 24 hr capsule; Take 1 capsule (10 mg total) by mouth daily.    Please see After Visit Summary for patient specific instructions.  Future Appointments  Date Time Provider Department Center  07/15/2021 11:00 AM Stevphen Meuse, [facility] CP-CP None  09/08/2021  9:30 AM Corie Chiquito, [facility]NP CP-CP None    No orders of the defined types were placed in this encounter.   -------------------------------

## 2021-07-15 ENCOUNTER — Other Ambulatory Visit: Payer: Self-pay

## 2021-07-15 ENCOUNTER — Ambulatory Visit (INDEPENDENT_AMBULATORY_CARE_PROVIDER_SITE_OTHER): Payer: BC Managed Care – PPO | Admitting: Psychiatry

## 2021-07-15 DIAGNOSIS — F4323 Adjustment disorder with mixed anxiety and depressed mood: Secondary | ICD-10-CM

## 2021-07-15 NOTE — Progress Notes (Signed)
      [facility] Counselor/Therapist Progress Note  Patient ID: SAVANAH BAYLES, MRN: [id number],    Date: 07/15/2021  Time Spent: 60 minutes start time 10:59 AM end time 11:59 AM  Treatment Type: Individual Therapy  Reported Symptoms: anxiety, sadness, frustration  Mental Status Exam:  Appearance:   Casual     Behavior:  Appropriate  Motor:  Normal  Speech/Language:   Normal Rate  Affect:  Appropriate  Mood:  anxious  Thought process:  normal  Thought content:    WNL  Sensory/Perceptual disturbances:    WNL  Orientation:  oriented to person, place, time/date, and situation  Attention:  Good  Concentration:  Good  Memory:  WNL  Fund of knowledge:   Good  Insight:    Good  Judgment:   Good  Impulse Control:  Good   Risk Assessment: Danger to Self:  No Self-injurious Behavior: No Danger to Others: No Duty to Warn:no Physical Aggression / Violence:No  Access to Firearms a concern: No  Gang Involvement:No   Subjective: Patient was present for session.  She shared that her trip was good overall. She discussed the dynamic with her sister and how that has impacted her.  Patient processed through the situation with her sister, suds level 8, negative cognition "I am powerless" felt sadness in her head.  Patient was able to reduce suds level to 3.  She was able to recognize which she can control fix and change and what she needs to let go of.  Interventions: Cognitive Behavioral Therapy, Solution-Oriented/Positive Psychology, and BS P  Diagnosis:   ICD-10-CM   1. Adjustment disorder with mixed anxiety and depressed mood  F43.23       Plan: Patient is to use CBT and coping skills to decrease anxiety symptoms.  Patient is to work on the things that she can control fix and change.  Patient is to take medication as directed. Long-term goal: Resolve the core conflict that is a source of anxiety Short-term goal: Identify the major life complex in the past and present the form the  basis for present anxiety  Stevphen Meuse, [facility]

## 2021-08-04 ENCOUNTER — Telehealth: Payer: Self-pay | Admitting: Psychiatry

## 2021-08-04 NOTE — Telephone Encounter (Signed)
Pt LVM stating that Shanda Bumps was going to check on the price hike of the Oxcarbazepine.  She is wondering what she found out.  Next appt 1/9

## 2021-08-04 NOTE — Telephone Encounter (Signed)
Tiffany Terry looked into this and I think confirmed that this was the correct price and they pharmacy said they were running it through the same way and were surprised as well. Tiffany Terry, would you please contact her about what you found out? She may want to see if there is a cheaper option through [facility]. Not sure if coverage may change or improve in the new year.

## 2021-08-04 NOTE — Telephone Encounter (Signed)
Noted yes I will contact her

## 2021-08-04 NOTE — Telephone Encounter (Signed)
Please see message. °

## 2021-08-08 ENCOUNTER — Other Ambulatory Visit: Payer: Self-pay

## 2021-08-08 ENCOUNTER — Telehealth: Payer: Self-pay | Admitting: Psychiatry

## 2021-08-08 DIAGNOSIS — F3281 Premenstrual dysphoric disorder: Secondary | ICD-10-CM

## 2021-08-08 MED ORDER — OXCARBAZEPINE 300 MG PO TABS: 450.0000 mg | ORAL_TABLET | Freq: Every day | ORAL | 1 refills | Status: DC

## 2021-08-08 NOTE — Telephone Encounter (Signed)
Couldn't get through after 25 minutes on hold for her pharmacy but looked at [facility] prices.   90 day at [facility] $24.39 90 day at [facility]>$56.76 90 day [facility] $73.82 90 day [facility] $92.54  Will let her know costs

## 2021-08-08 NOTE — Telephone Encounter (Signed)
Sherie called following up checking the status of checking on the price of Oxcarbazepine that was being done. She has not heard from anyone. Her phone number is [phone].

## 2021-08-08 NOTE — Telephone Encounter (Signed)
Thanks, I was trying to reach out to pharmacy again about the cost. Will call her after that.

## 2021-08-08 NOTE — Telephone Encounter (Signed)
See other phone message. This is completed

## 2021-08-08 NOTE — Telephone Encounter (Signed)
See messages in regards to this medication.

## 2021-08-08 NOTE — Telephone Encounter (Signed)
Rtc to pt and asked if her insurance was changing first of the year, she says not as far as she knows. I informed her about [facility] and the costs, she agreed to use [facility]. I also pulled up [facility] and it was even cheaper at $18.43. She requested [facility].  Explained how to use [facility] and she was very Adult nurse. I did go ahead and send [facility] Rx to [facility] on [address] [facility] [address] [facility].

## 2021-09-08 ENCOUNTER — Other Ambulatory Visit: Payer: Self-pay

## 2021-09-08 ENCOUNTER — Encounter: Payer: Self-pay | Admitting: Psychiatry

## 2021-09-08 ENCOUNTER — Ambulatory Visit (INDEPENDENT_AMBULATORY_CARE_PROVIDER_SITE_OTHER): Payer: BC Managed Care – PPO | Admitting: Psychiatry

## 2021-09-08 VITALS — BP 123/79 | HR 54

## 2021-09-08 DIAGNOSIS — F3281 Premenstrual dysphoric disorder: Secondary | ICD-10-CM | POA: Diagnosis not present

## 2021-09-08 DIAGNOSIS — F4323 Adjustment disorder with mixed anxiety and depressed mood: Secondary | ICD-10-CM

## 2021-09-08 MED ORDER — LAMOTRIGINE 100 MG PO TABS
ORAL_TABLET | ORAL | 0 refills | Status: DC
Start: 2021-09-08 — End: 2021-10-09

## 2021-09-08 MED ORDER — ALBUTEROL SULFATE HFA 108 (90 BASE) MCG/ACT IN AERS: 1.0000 | INHALATION_SPRAY | Freq: Four times a day (QID) | RESPIRATORY_TRACT | 0 refills | Status: DC | PRN

## 2021-09-08 MED ORDER — LAMOTRIGINE 25 MG PO TABS: ORAL_TABLET | ORAL | 0 refills | Status: DC

## 2021-09-08 MED ORDER — BUSPIRONE HCL 15 MG PO TABS: 15.0000 mg | ORAL_TABLET | Freq: Two times a day (BID) | ORAL | 0 refills | Status: DC

## 2021-09-08 NOTE — Progress Notes (Signed)
Tiffany IllLeah C Blevens [id number] 08/07/1991 30 y.o.  Subjective:   Patient ID:  Tiffany Terry is a 31 y.o. (DOB 10/06/1990) female.  Chief Complaint:  Chief Complaint  Patient presents with   Other    Periods of irritation, mood lability   Follow-up    Anxiety    HPI Tiffany IllLeah C Farabee presents to the office today for follow-up of anxiety and mood disturbance. Notices some "ups and downs." She reports that she has periods of improved mood where energy and motivation are improved and periods where energy and motivation are lower. She reports mood cycles last about 2 weeks. Denies elevated moods.  Reports that PMDD s/s were significantly improved last menstrual cycles. She reports, "I don't feel as anxious anymore." Continues to have episodes of frustration "fairly often." Reports frustration when having communication difficulties. Denies persistent irritability. Has experienced some mild depression which she attributes the holidays. Some slight decrease energy and motivation. Appetite is typically increased later in the day. She reports adequate concentration. Notices concentration wanes later in the day. Some difficulty with getting started with things at work and at home. Sleep has been good. Has been on a more consistent sleep schedule. Denies SI.   ETOH use has "been ok."   Past Psychiatric Medication Trials: Wellbutrin- Adverse reaction. Had dissociation and panic.  Sertraline- Ineffective, no side effects. No worsening anxiety, irritability, or insomnia Concerta Vyvanse- Effective- Minimally effective Adzenys XR Adderall XR Trileptal  PHQ2-9    Flowsheet Row [facility]  PHQ-2 Total Score 0        Review of Systems:  Review of Systems  Musculoskeletal:  Negative for gait problem.  Neurological:  Negative for dizziness and tremors.  Psychiatric/Behavioral:         Please refer to HPI   Medications: I have reviewed the patient's  current medications.  Current Outpatient Medications  Medication Sig Dispense Refill   [START ON 09/18/2021] amphetamine-dextroamphetamine (ADDERALL XR) 10 MG 24 hr capsule Take 1 capsule (10 mg total) by mouth daily. 30 capsule 0   lamoTRIgine (LAMICTAL) 100 MG tablet Take 1 tab po qd x 2 weeks, then increase to 1.5 tabs po qd 45 tablet 0   lamoTRIgine (LAMICTAL) 25 MG tablet Take 1 tablet (25 mg total) by mouth daily for 14 days, THEN 2 tablets (50 mg total) daily for 14 days. 45 tablet 0   Oxcarbazepine (TRILEPTAL) 300 MG tablet Take 1.5 tablets (450 mg total) by mouth at bedtime. 135 tablet 1   albuterol (VENTOLIN HFA) 108 (90 Base) MCG/ACT inhaler Inhale 1-2 puffs into the lungs every 6 (six) hours as needed for wheezing or shortness of breath. 1 each 0   amphetamine-dextroamphetamine (ADDERALL XR) 10 MG 24 hr capsule Take 1 capsule (10 mg total) by mouth daily. 30 capsule 0   amphetamine-dextroamphetamine (ADDERALL XR) 10 MG 24 hr capsule Take 1 capsule (10 mg total) by mouth daily. 30 capsule 0   busPIRone (BUSPAR) 15 MG tablet Take 1 tablet (15 mg total) by mouth 2 (two) times daily. 180 tablet 0   cetirizine (ZYRTEC) 10 MG tablet Take 10 mg by mouth daily as needed. (Patient not taking: Reported on 09/08/2021)     No current facility-administered medications for this visit.    Medication Side Effects: None  Allergies: No Known Allergies  Past Medical History:  Diagnosis Date   Asthma     Past Medical History, Surgical history, Social history, and Family  history were reviewed and updated as appropriate.   Please see review of systems for further details on the patient's review from today.   Objective:   Physical Exam:  BP 123/79    Pulse (!) 54   Physical Exam Constitutional:      General: She is not in acute distress. Musculoskeletal:        General: No deformity.  Neurological:     Mental Status: She is alert and oriented to person, place, and time.     Coordination:  Coordination normal.  Psychiatric:        Attention and Perception: Attention and perception normal. She does not perceive auditory or visual hallucinations.        Mood and Affect: Mood normal. Mood is not anxious or depressed. Affect is not labile, blunt, angry or inappropriate.        Speech: Speech normal.        Behavior: Behavior normal.        Thought Content: Thought content normal. Thought content is not paranoid or delusional. Thought content does not include homicidal or suicidal ideation. Thought content does not include homicidal or suicidal plan.        Cognition and Memory: Cognition and memory normal.        Judgment: Judgment normal.     Comments: Insight intact    Lab Review:     Component Value Date/Time   NA 140 07/24/2019 1841   K 3.9 07/24/2019 1841   CL 106 07/24/2019 1841   CO2 24 07/24/2019 1841   GLUCOSE 81 07/24/2019 1841   BUN 7 07/24/2019 1841   CREATININE 0.69 07/24/2019 1841   CALCIUM 9.5 07/24/2019 1841   PROT 7.3 07/24/2019 1841   ALBUMIN 4.2 07/24/2019 1841   AST 18 07/24/2019 1841   ALT 16 07/24/2019 1841   ALKPHOS 45 07/24/2019 1841   BILITOT 0.5 07/24/2019 1841   GFRNONAA >60 07/24/2019 1841   GFRAA >60 07/24/2019 1841       Component Value Date/Time   WBC 7.4 07/24/2019 1841   RBC 4.34 07/24/2019 1841   HGB 14.3 07/24/2019 1841   HCT 42.1 07/24/2019 1841   PLT 358 07/24/2019 1841   MCV 97.0 07/24/2019 1841   MCH 32.9 07/24/2019 1841   MCHC 34.0 07/24/2019 1841   RDW 12.5 07/24/2019 1841   LYMPHSABS 2.6 07/24/2019 1841   MONOABS 0.5 07/24/2019 1841   EOSABS 0.1 07/24/2019 1841   BASOSABS 0.1 07/24/2019 1841    No results found for: POCLITH, LITHIUM   No results found for: PHENYTOIN, PHENOBARB, VALPROATE, CBMZ   .res Assessment: Plan:   Pt seen for 30 minutes and time spent discussing possible treatment options for mood lability. Counseled patient regarding potential benefits, risks, and side effects of Lamictal to include  potential risk of Stevens-Johnson syndrome. Advised patient to stop taking Lamictal and contact office immediately if rash develops and to seek urgent medical attention if rash is severe and/or spreading quickly. Will start Lamictal 25 mg daily for 2 weeks, then increase to 50 mg daily for 2 weeks, then 100 mg daily for 2 weeks, then 150 mg daily for mood symptoms. Discussed that Lamictal is approved for Bipolar Depression and is used off-label for depression without bipolar disorder and for PMDD.  Continue Buspar 15 mg po BID for anxiety.  Continue Adderall XR 10 mg po qd for ADHD.  Continue Trileptal 450 mg po QHS for mood and anxiety.  Pt to follow-up in 2  months or sooner if clinically indicated.  Patient advised to contact office with any questions, adverse effects, or acute worsening in signs and symptoms.   Dean was seen today for other and follow-up.  Diagnoses and all orders for this visit:  PMDD (premenstrual dysphoric disorder) -     lamoTRIgine (LAMICTAL) 25 MG tablet; Take 1 tablet (25 mg total) by mouth daily for 14 days, THEN 2 tablets (50 mg total) daily for 14 days. -     lamoTRIgine (LAMICTAL) 100 MG tablet; Take 1 tab po qd x 2 weeks, then increase to 1.5 tabs po qd  Adjustment disorder with mixed anxiety and depressed mood -     lamoTRIgine (LAMICTAL) 25 MG tablet; Take 1 tablet (25 mg total) by mouth daily for 14 days, THEN 2 tablets (50 mg total) daily for 14 days. -     lamoTRIgine (LAMICTAL) 100 MG tablet; Take 1 tab po qd x 2 weeks, then increase to 1.5 tabs po qd -     busPIRone (BUSPAR) 15 MG tablet; Take 1 tablet (15 mg total) by mouth 2 (two) times daily.  Other orders -     albuterol (VENTOLIN HFA) 108 (90 Base) MCG/ACT inhaler; Inhale 1-2 puffs into the lungs every 6 (six) hours as needed for wheezing or shortness of breath.     Please see After Visit Summary for patient specific instructions.  Future Appointments  Date Time Provider Department Center   09/24/2021  1:00 PM Stevphen Meuse, [facility] CP-CP None  10/08/2021  1:00 PM Stevphen Meuse, [facility] CP-CP None  11/05/2021 11:00 AM Stevphen Meuse, [facility] CP-CP None  11/06/2021  9:00 AM Corie Chiquito, [facility]NP CP-CP None    No orders of the defined types were placed in this encounter.   -------------------------------

## 2021-09-24 ENCOUNTER — Ambulatory Visit: Payer: BC Managed Care – PPO | Admitting: Psychiatry

## 2021-10-08 ENCOUNTER — Other Ambulatory Visit: Payer: Self-pay

## 2021-10-08 ENCOUNTER — Ambulatory Visit (INDEPENDENT_AMBULATORY_CARE_PROVIDER_SITE_OTHER): Payer: BC Managed Care – PPO | Admitting: Psychiatry

## 2021-10-08 DIAGNOSIS — F4323 Adjustment disorder with mixed anxiety and depressed mood: Secondary | ICD-10-CM | POA: Diagnosis not present

## 2021-10-08 NOTE — Progress Notes (Signed)
°      [facility] Counselor/Therapist Progress Note  Patient ID: Tiffany Terry, MRN: [id number],    Date: 10/08/2021  Time Spent: 50 minutes start time 1:10 PM end time 2 PM  Treatment Type: Individual Therapy  Reported Symptoms: anxiety, triggered responses, sadness  Mental Status Exam:  Appearance:   Casual     Behavior:  Appropriate  Motor:  Normal  Speech/Language:   Normal Rate  Affect:  Appropriate  Mood:  anxious  Thought process:  normal  Thought content:    WNL  Sensory/Perceptual disturbances:    WNL  Orientation:  oriented to person, place, time/date, and situation  Attention:  Good  Concentration:  Good  Memory:  WNL  Fund of knowledge:   Good  Insight:    Good  Judgment:   Good  Impulse Control:  Good   Risk Assessment: Danger to Self:  No Self-injurious Behavior: No Danger to Others: No Duty to Warn:no Physical Aggression / Violence:No  Access to Firearms a concern: No  Gang Involvement:No   Subjective: Patient was present for session.  She shared that Holidays were stressful figuring out relationship dynamics.  She shared that things were better after that. Work is busy but going okay a difficult coworker is leaving soon so that it improvement. Finances are an issue but there is no time for anything else right now. She shared that there are some communication issues with her girlfriend which is hard. Patient was allowed time to think through what was going on with she and her girlfriend. Patient was able to realize that they have different love languages and that her last girlfriend had wanted to take more of a lead in things but her current girlfriend was wanting her to take more of a lead.  She was able to develop some plans that would help her work on trying to love her girlfriend in her love language and she was also able to think through a plan to talk with her about the situation and what was working and what they needed to change.  Interventions:  Solution-Oriented/Positive Psychology and Insight-Oriented  Diagnosis:   ICD-10-CM   1. Adjustment disorder with mixed anxiety and depressed mood  F43.23       Plan: Patient is to use CBT and coping skills to decrease anxiety symptoms.  Patient is to work on the things that she can control fix and change. Patient is to follow plans from session to deal with the communication issues in her relationship. Patient is to take medication as directed. Long-term goal: Resolve the core conflict that is a source of anxiety Short-term goal: Identify the major life complex in the past and present the form the basis for present anxiety  Stevphen Meuse, [facility]

## 2021-10-09 ENCOUNTER — Other Ambulatory Visit: Payer: Self-pay

## 2021-10-09 DIAGNOSIS — F3281 Premenstrual dysphoric disorder: Secondary | ICD-10-CM

## 2021-10-09 DIAGNOSIS — F4323 Adjustment disorder with mixed anxiety and depressed mood: Secondary | ICD-10-CM

## 2021-10-09 MED ORDER — LAMOTRIGINE 100 MG PO TABS: ORAL_TABLET | ORAL | 0 refills | Status: DC

## 2021-10-09 MED ORDER — ALBUTEROL SULFATE HFA 108 (90 BASE) MCG/ACT IN AERS: 1.0000 | INHALATION_SPRAY | Freq: Four times a day (QID) | RESPIRATORY_TRACT | 1 refills | Status: AC | PRN

## 2021-11-05 ENCOUNTER — Ambulatory Visit: Payer: BC Managed Care – PPO | Admitting: Psychiatry

## 2021-11-06 ENCOUNTER — Ambulatory Visit (INDEPENDENT_AMBULATORY_CARE_PROVIDER_SITE_OTHER): Payer: BC Managed Care – PPO | Admitting: Psychiatry

## 2021-11-06 ENCOUNTER — Other Ambulatory Visit: Payer: Self-pay

## 2021-11-06 ENCOUNTER — Telehealth: Payer: Self-pay | Admitting: Psychiatry

## 2021-11-06 ENCOUNTER — Encounter: Payer: Self-pay | Admitting: Psychiatry

## 2021-11-06 VITALS — BP 124/86 | HR 75

## 2021-11-06 DIAGNOSIS — F902 Attention-deficit hyperactivity disorder, combined type: Secondary | ICD-10-CM

## 2021-11-06 DIAGNOSIS — F4323 Adjustment disorder with mixed anxiety and depressed mood: Secondary | ICD-10-CM

## 2021-11-06 DIAGNOSIS — F3281 Premenstrual dysphoric disorder: Secondary | ICD-10-CM

## 2021-11-06 MED ORDER — AMPHETAMINE-DEXTROAMPHET ER 15 MG PO CP24: 15.0000 mg | ORAL_CAPSULE | ORAL | 0 refills | Status: DC

## 2021-11-06 MED ORDER — BUSPIRONE HCL 15 MG PO TABS: 15.0000 mg | ORAL_TABLET | Freq: Two times a day (BID) | ORAL | 0 refills | Status: DC

## 2021-11-06 MED ORDER — LAMOTRIGINE 100 MG PO TABS: 200.0000 mg | ORAL_TABLET | Freq: Every day | ORAL | 3 refills | Status: DC

## 2021-11-06 MED ORDER — LAMOTRIGINE 200 MG PO TABS: 200.0000 mg | ORAL_TABLET | Freq: Every day | ORAL | 3 refills | Status: DC

## 2021-11-06 MED ORDER — OXCARBAZEPINE 300 MG PO TABS: 450.0000 mg | ORAL_TABLET | Freq: Every day | ORAL | 1 refills | Status: DC

## 2021-11-06 NOTE — Telephone Encounter (Signed)
See note from pharmacy. ? ?lamoTRIgine (LAMICTAL) 100 MG tablet 30 tablet 3 11/06/2021 12/06/2021   ?Sig - Route: Take 2 tablets (200 mg total) by mouth daily. Take 1.5 tabs po qd - Oral   ? ?Your note today wasn't complete yet for me to check, but your note from previous visit was 1.5 tabs.  ?

## 2021-11-06 NOTE — Progress Notes (Signed)
AMANNI OSTHOFF ?[id number] ?1991-04-12 ?31 y.o. ? ?Subjective:  ? ?Patient ID:  Tiffany Terry is a 31 y.o. (DOB [birth year]) female. ? ?Chief Complaint:  ?Chief Complaint  ?Patient presents with  ? Follow-up  ?  Anxiety, depression, ADHD  ? ? ?HPI ?Tiffany Terry presents to the office today for follow-up of anxiety, depression, and ADHD. Notices more "clarity" and awareness with different moods and able to "pull myself out of it." Notices some "slumps" that last about a week and occur about once a month. Denies a change in mood with menstrual cycle. Has had some irritability yesterday in the context of work stress. Denies impulsive or risky behavior. Denies elevated mood. Anxiety has been somewhat improved. Denies recent panic attacks. Sleeping well. Appetite has been good. Energy and motivation are lower during the "slumps" and otherwise motivation and energy have been good. Has been more productive at work and coming home and doing chores. Concentration "comes and goes." Denies SI.  ? ?ETOh use "comes and goes with the slumps." Increased ETOH use during slumps and otherwise is drinking 1-2 drinks daily. ? ?Past Psychiatric Medication Trials: ?Wellbutrin- Adverse reaction. Had dissociation and panic.  ?Sertraline- Ineffective, no side effects. No worsening anxiety, irritability, or insomnia ?Concerta ?Vyvanse- Effective- Minimally effective ?Adzenys XR ?Adderall XR ?Trileptal ? ?PHQ2-9   ? ?[place] Office Visit from 07/10/2014 in [place]  ?PHQ-2 Total Score 0  ? ?  ?  ? ?Review of Systems:  ?Review of Systems  ?Musculoskeletal:  Negative for gait problem.  ?Skin:  Negative for rash.  ?Psychiatric/Behavioral:    ?     Please refer to HPI  ? ?Medications: I have reviewed the patient's current medications. ? ?Current Outpatient Medications  ?Medication Sig Dispense Refill  ? albuterol (VENTOLIN HFA) 108 (90 Base) MCG/ACT inhaler Inhale 1-2 puffs into the lungs every 6 (six) hours as needed for  wheezing or shortness of breath. 1 each 1  ? amphetamine-dextroamphetamine (ADDERALL XR) 15 MG 24 hr capsule Take 1 capsule by mouth every morning. 30 capsule 0  ? [START ON 12/04/2021] amphetamine-dextroamphetamine (ADDERALL XR) 15 MG 24 hr capsule Take 1 capsule by mouth every morning. 30 capsule 0  ? [START ON 01/01/2022] amphetamine-dextroamphetamine (ADDERALL XR) 15 MG 24 hr capsule Take 1 capsule by mouth every morning. 30 capsule 0  ? busPIRone (BUSPAR) 15 MG tablet Take 1 tablet (15 mg total) by mouth 2 (two) times daily. 180 tablet 0  ? cetirizine (ZYRTEC) 10 MG tablet Take 10 mg by mouth daily as needed. (Patient not taking: Reported on 09/08/2021)    ? lamoTRIgine (LAMICTAL) 200 MG tablet Take 1 tablet (200 mg total) by mouth daily. 30 tablet 3  ? Oxcarbazepine (TRILEPTAL) 300 MG tablet Take 1.5 tablets (450 mg total) by mouth at bedtime. 135 tablet 1  ? ?No current facility-administered medications for this visit.  ? ? ?Medication Side Effects: None ? ?Allergies: No Known Allergies ? ?Past Medical History:  ?Diagnosis Date  ? Asthma   ? ? ?Past Medical History, Surgical history, Social history, and Family history were reviewed and updated as appropriate.  ? ?Please see review of systems for further details on the patient's review from today.  ? ?Objective:  ? ?Physical Exam:  ?BP 124/86   Pulse 75  ? ?Physical Exam ?Constitutional:   ?   General: She is not in acute distress. ?Musculoskeletal:     ?   General: No deformity.  ?Neurological:  ?  Mental Status: She is alert and oriented to person, place, and time.  ?   Coordination: Coordination normal.  ?Psychiatric:     ?   Attention and Perception: Attention and perception normal. She does not perceive auditory or visual hallucinations.     ?   Mood and Affect: Mood normal. Mood is not anxious or depressed. Affect is not labile, blunt, angry or inappropriate.     ?   Speech: Speech normal.     ?   Behavior: Behavior normal.     ?   Thought Content: Thought  content normal. Thought content is not paranoid or delusional. Thought content does not include homicidal or suicidal ideation. Thought content does not include homicidal or suicidal plan.     ?   Cognition and Memory: Cognition and memory normal.     ?   Judgment: Judgment normal.  ?   Comments: Insight intact  ? ? ?Lab Review:  ?   ?Component Value Date/Time  ? NA 140 07/24/2019 1841  ? K 3.9 07/24/2019 1841  ? CL 106 07/24/2019 1841  ? CO2 24 07/24/2019 1841  ? GLUCOSE 81 07/24/2019 1841  ? BUN 7 07/24/2019 1841  ? CREATININE 0.69 07/24/2019 1841  ? CALCIUM 9.5 07/24/2019 1841  ? PROT 7.3 07/24/2019 1841  ? ALBUMIN 4.2 07/24/2019 1841  ? AST 18 07/24/2019 1841  ? ALT 16 07/24/2019 1841  ? ALKPHOS 45 07/24/2019 1841  ? BILITOT 0.5 07/24/2019 1841  ? GFRNONAA >60 07/24/2019 1841  ? GFRAA >60 07/24/2019 1841  ? ? ?   ?Component Value Date/Time  ? WBC 7.4 07/24/2019 1841  ? RBC 4.34 07/24/2019 1841  ? HGB 14.3 07/24/2019 1841  ? HCT 42.1 07/24/2019 1841  ? PLT 358 07/24/2019 1841  ? MCV 97.0 07/24/2019 1841  ? MCH 32.9 07/24/2019 1841  ? MCHC 34.0 07/24/2019 1841  ? RDW 12.5 07/24/2019 1841  ? LYMPHSABS 2.6 07/24/2019 1841  ? MONOABS 0.5 07/24/2019 1841  ? EOSABS 0.1 07/24/2019 1841  ? BASOSABS 0.1 07/24/2019 1841  ? ? ?No results found for: POCLITH, LITHIUM  ? ?No results found for: PHENYTOIN, PHENOBARB, VALPROATE, CBMZ  ? ?.res ?Assessment: Plan:   ?Pt seen for 30 minutes and time spent discussing potential benefits, risks, and side effects of increasing Lamictal to 200 mg po qd to possibly prevent occasional depressive s/s. Pt agrees to increase in Lamictal. ?Will increase Adderall XR to 15 mg po qd due to some partial improvement with 10 mg dose in ADHD s/s.  ?Continue Oxcarbazepine 450 mg at bedtime for mood and anxiety s/s.  ?Continue Buspar 15 mg po BID for anxiety.  ?Pt to follow-up in 3 months or sooner if clinically indicated.  ?Patient advised to contact office with any questions, adverse effects, or acute  worsening in signs and symptoms. ? ?Tiffany Terry was seen today for follow-up. ? ?Diagnoses and all orders for this visit: ? ?Attention deficit hyperactivity disorder (ADHD), combined type ?-     amphetamine-dextroamphetamine (ADDERALL XR) 15 MG 24 hr capsule; Take 1 capsule by mouth every morning. ?-     amphetamine-dextroamphetamine (ADDERALL XR) 15 MG 24 hr capsule; Take 1 capsule by mouth every morning. ?-     amphetamine-dextroamphetamine (ADDERALL XR) 15 MG 24 hr capsule; Take 1 capsule by mouth every morning. ? ?PMDD (premenstrual dysphoric disorder) ?-     Oxcarbazepine (TRILEPTAL) 300 MG tablet; Take 1.5 tablets (450 mg total) by mouth at bedtime. ?-  Discontinue: lamoTRIgine (LAMICTAL) 100 MG tablet; Take 2 tablets (200 mg total) by mouth daily. Take 1.5 tabs po qd ?-     lamoTRIgine (LAMICTAL) 200 MG tablet; Take 1 tablet (200 mg total) by mouth daily. ? ?Adjustment disorder with mixed anxiety and depressed mood ?-     Discontinue: lamoTRIgine (LAMICTAL) 100 MG tablet; Take 2 tablets (200 mg total) by mouth daily. Take 1.5 tabs po qd ?-     busPIRone (BUSPAR) 15 MG tablet; Take 1 tablet (15 mg total) by mouth 2 (two) times daily. ?-     lamoTRIgine (LAMICTAL) 200 MG tablet; Take 1 tablet (200 mg total) by mouth daily. ? ?  ? ?Please see After Visit Summary for patient specific instructions. ? ?Future Appointments  ?Date Time Provider [place]  ?12/24/2021  2:00 PM Lina Sayre, [facility] CP-CP None  ? ? ?No orders of the defined types were placed in this encounter. ? ? ?------------------------------- ?

## 2021-11-06 NOTE — Telephone Encounter (Signed)
Order clarification sent

## 2021-11-06 NOTE — Telephone Encounter (Signed)
Tiffany Terry was just seen here this morning and [facility] just called and needs clarification on the prescription for Lamictal 100 mg that was called in to them. They state that they're are two sets of directions and need to know which is the correct one. Their phone number is [phone]. ?

## 2021-12-24 ENCOUNTER — Ambulatory Visit: Payer: BC Managed Care – PPO | Admitting: Psychiatry

## 2022-02-18 ENCOUNTER — Ambulatory Visit: Payer: BC Managed Care – PPO | Admitting: Psychiatry

## 2022-02-18 ENCOUNTER — Encounter: Payer: Self-pay | Admitting: Psychiatry

## 2022-02-18 VITALS — BP 135/79 | HR 73

## 2022-02-18 DIAGNOSIS — F3281 Premenstrual dysphoric disorder: Secondary | ICD-10-CM | POA: Diagnosis not present

## 2022-02-18 DIAGNOSIS — F4323 Adjustment disorder with mixed anxiety and depressed mood: Secondary | ICD-10-CM

## 2022-02-18 DIAGNOSIS — F902 Attention-deficit hyperactivity disorder, combined type: Secondary | ICD-10-CM

## 2022-02-18 MED ORDER — BUSPIRONE HCL 15 MG PO TABS: 15.0000 mg | ORAL_TABLET | Freq: Two times a day (BID) | ORAL | 1 refills | Status: DC

## 2022-02-18 MED ORDER — AMPHETAMINE-DEXTROAMPHET ER 15 MG PO CP24: 15.0000 mg | ORAL_CAPSULE | ORAL | 0 refills | Status: DC

## 2022-02-18 MED ORDER — OXCARBAZEPINE 300 MG PO TABS: 450.0000 mg | ORAL_TABLET | Freq: Every day | ORAL | 0 refills | Status: DC

## 2022-02-18 MED ORDER — LAMOTRIGINE 200 MG PO TABS: 200.0000 mg | ORAL_TABLET | Freq: Every day | ORAL | 1 refills | Status: DC

## 2022-02-18 NOTE — Progress Notes (Signed)
Tiffany Terry [id number] Sep 21, 1990 31 y.o.  Subjective:   Patient ID:  Tiffany Terry is a 31 y.o. (DOB Feb 19, 1991) female.  Chief Complaint:  Chief Complaint  Patient presents with   Follow-up    Anxiety, depression, ADHD    HPI Tiffany Terry presents to the office today for follow-up of anxiety, depression, and ADHD. Denies concerns. Denies significant anxiety. Mood has been "fine." Denies severe irritability and "can shake it" when irritability occurs. Denies depressed mood. Started going to the gym and this is helpful for mood symptoms. Reports sleep amount varies and sleeps 5-9 hours. Denies difficulty falling asleep. Energy and motivation at home has been better at home and less at work. Concentration is ok overall and notices concentration and motivation are lower when unable to get Adderall XR. Has been eating regularly and more consistent with packing breakfast, lunch, and snacks. Denies SI.   Work has been going ok. She reports that there have been some recent changes at work.   Has occasionally had brief lapses in Adderall XR due to shortage. Adderall XR last filled 11/06/21.  Past Psychiatric Medication Trials: Wellbutrin- Adverse reaction. Had dissociation and panic.  Sertraline- Ineffective, no side effects. No worsening anxiety, irritability, or insomnia Concerta Vyvanse- Effective- Minimally effective Adzenys XR Adderall XR Trileptal  PHQ2-9    Flowsheet Row Office Visit from 07/10/2014 in [facility]  PHQ-2 Total Score 0        Review of Systems:  Review of Systems  Cardiovascular:  Negative for palpitations.  Musculoskeletal:  Negative for gait problem.  Neurological:  Negative for tremors.  Psychiatric/Behavioral:         Please refer to HPI    Medications: I have reviewed the patient's current medications.  Current Outpatient Medications  Medication Sig Dispense Refill   albuterol (VENTOLIN HFA) 108 (90 Base) MCG/ACT inhaler Inhale  1-2 puffs into the lungs every 6 (six) hours as needed for wheezing or shortness of breath. 1 each 1   amphetamine-dextroamphetamine (ADDERALL XR) 15 MG 24 hr capsule Take 1 capsule by mouth every morning. 30 capsule 0   cetirizine (ZYRTEC) 10 MG tablet Take 10 mg by mouth daily as needed.     amphetamine-dextroamphetamine (ADDERALL XR) 15 MG 24 hr capsule Take 1 capsule by mouth every morning. 30 capsule 0   [START ON 04/15/2022] amphetamine-dextroamphetamine (ADDERALL XR) 15 MG 24 hr capsule Take 1 capsule by mouth every morning. 30 capsule 0   busPIRone (BUSPAR) 15 MG tablet Take 1 tablet (15 mg total) by mouth 2 (two) times daily. 180 tablet 1   lamoTRIgine (LAMICTAL) 200 MG tablet Take 1 tablet (200 mg total) by mouth daily. 90 tablet 1   Oxcarbazepine (TRILEPTAL) 300 MG tablet Take 1.5 tablets (450 mg total) by mouth at bedtime. 135 tablet 0   No current facility-administered medications for this visit.    Medication Side Effects: None  Allergies: No Known Allergies  Past Medical History:  Diagnosis Date   Asthma     Past Medical History, Surgical history, Social history, and Family history were reviewed and updated as appropriate.   Please see review of systems for further details on the patient's review from today.   Objective:   Physical Exam:  BP 135/79   Pulse 73   Physical Exam Constitutional:      General: She is not in acute distress. Musculoskeletal:        General: No deformity.  Neurological:  Mental Status: She is alert and oriented to person, place, and time.     Coordination: Coordination normal.  Psychiatric:        Attention and Perception: Attention and perception normal. She does not perceive auditory or visual hallucinations.        Mood and Affect: Mood normal. Mood is not anxious or depressed. Affect is not labile, blunt, angry or inappropriate.        Speech: Speech normal.        Behavior: Behavior normal.        Thought Content: Thought  content normal. Thought content is not paranoid or delusional. Thought content does not include homicidal or suicidal ideation. Thought content does not include homicidal or suicidal plan.        Cognition and Memory: Cognition and memory normal.        Judgment: Judgment normal.     Comments: Insight intact     Lab Review:     Component Value Date/Time   NA 140 07/24/2019 1841   K 3.9 07/24/2019 1841   CL 106 07/24/2019 1841   CO2 24 07/24/2019 1841   GLUCOSE 81 07/24/2019 1841   BUN 7 07/24/2019 1841   CREATININE 0.69 07/24/2019 1841   CALCIUM 9.5 07/24/2019 1841   PROT 7.3 07/24/2019 1841   ALBUMIN 4.2 07/24/2019 1841   AST 18 07/24/2019 1841   ALT 16 07/24/2019 1841   ALKPHOS 45 07/24/2019 1841   BILITOT 0.5 07/24/2019 1841   GFRNONAA >60 07/24/2019 1841   GFRAA >60 07/24/2019 1841       Component Value Date/Time   WBC 7.4 07/24/2019 1841   RBC 4.34 07/24/2019 1841   HGB 14.3 07/24/2019 1841   HCT 42.1 07/24/2019 1841   PLT 358 07/24/2019 1841   MCV 97.0 07/24/2019 1841   MCH 32.9 07/24/2019 1841   MCHC 34.0 07/24/2019 1841   RDW 12.5 07/24/2019 1841   LYMPHSABS 2.6 07/24/2019 1841   MONOABS 0.5 07/24/2019 1841   EOSABS 0.1 07/24/2019 1841   BASOSABS 0.1 07/24/2019 1841    No results found for: "POCLITH", "LITHIUM"   No results found for: "PHENYTOIN", "PHENOBARB", "VALPROATE", "CBMZ"   .res Assessment: Plan:   Continue Lamictal 200 mg po qd  for depressive s/s.  Continue Adderall XR to 15 mg po qd due to some partial improvement with 10 mg dose in ADHD s/s.  Continue Oxcarbazepine 450 mg at bedtime for mood and anxiety s/s.  Continue Buspar 15 mg po BID for anxiety.  Pt to follow-up in 3 months or sooner if clinically indicated.  Patient advised to contact office with any questions, adverse effects, or acute worsening in signs and symptoms. Recommend continuing therapy with Stevphen Meuse, [facility].    Anh was seen today for follow-up.  Diagnoses and all  orders for this visit:  Attention deficit hyperactivity disorder (ADHD), combined type -     amphetamine-dextroamphetamine (ADDERALL XR) 15 MG 24 hr capsule; Take 1 capsule by mouth every morning.  PMDD (premenstrual dysphoric disorder) -     lamoTRIgine (LAMICTAL) 200 MG tablet; Take 1 tablet (200 mg total) by mouth daily. -     Oxcarbazepine (TRILEPTAL) 300 MG tablet; Take 1.5 tablets (450 mg total) by mouth at bedtime.  Adjustment disorder with mixed anxiety and depressed mood -     lamoTRIgine (LAMICTAL) 200 MG tablet; Take 1 tablet (200 mg total) by mouth daily. -     busPIRone (BUSPAR) 15 MG tablet; Take 1 tablet (  15 mg total) by mouth 2 (two) times daily.     Please see After Visit Summary for patient specific instructions.  Future Appointments  Date Time Provider Department Center  05/27/2022 10:00 AM Corie Chiquito, PMHNP CP-CP None    No orders of the defined types were placed in this encounter.   -------------------------------

## 2022-03-16 ENCOUNTER — Other Ambulatory Visit: Payer: Self-pay

## 2022-03-16 ENCOUNTER — Telehealth: Payer: Self-pay | Admitting: Psychiatry

## 2022-03-16 DIAGNOSIS — F902 Attention-deficit hyperactivity disorder, combined type: Secondary | ICD-10-CM

## 2022-03-16 MED ORDER — AMPHETAMINE-DEXTROAMPHET ER 15 MG PO CP24: 15.0000 mg | ORAL_CAPSULE | ORAL | 0 refills | Status: DC

## 2022-03-16 NOTE — Telephone Encounter (Signed)
Pt lvm at 12:09 pm asking for a refill on her adderall . She said that the [facility] located at [address]

## 2022-03-16 NOTE — Telephone Encounter (Signed)
Pended.

## 2022-05-27 ENCOUNTER — Encounter: Payer: Self-pay | Admitting: Psychiatry

## 2022-05-27 ENCOUNTER — Ambulatory Visit (INDEPENDENT_AMBULATORY_CARE_PROVIDER_SITE_OTHER): Payer: BC Managed Care – PPO | Admitting: Psychiatry

## 2022-05-27 DIAGNOSIS — F3281 Premenstrual dysphoric disorder: Secondary | ICD-10-CM

## 2022-05-27 DIAGNOSIS — F902 Attention-deficit hyperactivity disorder, combined type: Secondary | ICD-10-CM | POA: Diagnosis not present

## 2022-05-27 DIAGNOSIS — F4323 Adjustment disorder with mixed anxiety and depressed mood: Secondary | ICD-10-CM | POA: Diagnosis not present

## 2022-05-27 MED ORDER — OXCARBAZEPINE 300 MG PO TABS: 450.0000 mg | ORAL_TABLET | Freq: Every day | ORAL | 1 refills | Status: DC

## 2022-05-27 MED ORDER — AMPHETAMINE-DEXTROAMPHET ER 15 MG PO CP24: 15.0000 mg | ORAL_CAPSULE | ORAL | 0 refills | Status: DC

## 2022-05-27 MED ORDER — LAMOTRIGINE 200 MG PO TABS: 200.0000 mg | ORAL_TABLET | Freq: Every day | ORAL | 1 refills | Status: DC

## 2022-05-27 MED ORDER — BUSPIRONE HCL 15 MG PO TABS: 15.0000 mg | ORAL_TABLET | Freq: Two times a day (BID) | ORAL | 1 refills | Status: DC

## 2022-05-27 NOTE — Progress Notes (Signed)
Tiffany Terry [id number] 1991-01-14 31 y.o.  Subjective:   Patient ID:  Tiffany Terry is a 31 y.o. (DOB 04/12/1991) female.  Chief Complaint:  Chief Complaint  Patient presents with   Follow-up    Anxiety, mood disturbance, and ADHD    HPI Tiffany Terry presents to the office today for follow-up of anxiety, mood disturbance, and ADHD. "No major complaints." Mood has been stable. Denies depressed mood. Denies significant irritability. Denies impulsivity or risky behavior. Describes anxiety as "ok." Some occ stress or anxiety in response to family stressors. Brother has been dealing with increased anxiety. Sleeping well. Appetite has been up and down with Adderall. Makes a point to eat when taking Adderall and then is hungry at dinner. Adequate concentration with Adderall. Denies SI.   Recently went on vacation in [place].   Work has been ok.   Adderall XR last filled 04/25/22.   Past Psychiatric Medication Trials: Wellbutrin- Adverse reaction. Had dissociation and panic.  Sertraline- Ineffective, no side effects. No worsening anxiety, irritability, or insomnia Concerta Vyvanse- Effective- Minimally effective Adzenys XR Adderall XR Trileptal  PHQ2-9    Flowsheet Row Office Visit from 07/10/2014 in [facility]  PHQ-2 Total Score 0        Review of Systems:  Review of Systems  Musculoskeletal:  Negative for gait problem.  Neurological:  Negative for tremors and headaches.  Psychiatric/Behavioral:         Please refer to HPI    Medications: I have reviewed the patient's current medications.  Current Outpatient Medications  Medication Sig Dispense Refill   albuterol (VENTOLIN HFA) 108 (90 Base) MCG/ACT inhaler Inhale 1-2 puffs into the lungs every 6 (six) hours as needed for wheezing or shortness of breath. 1 each 1   cetirizine (ZYRTEC) 10 MG tablet Take 10 mg by mouth daily as needed.     [START ON 07/22/2022] amphetamine-dextroamphetamine (ADDERALL XR) 15  MG 24 hr capsule Take 1 capsule by mouth every morning. 30 capsule 0   [START ON 06/24/2022] amphetamine-dextroamphetamine (ADDERALL XR) 15 MG 24 hr capsule Take 1 capsule by mouth every morning. 30 capsule 0   amphetamine-dextroamphetamine (ADDERALL XR) 15 MG 24 hr capsule Take 1 capsule by mouth every morning. 30 capsule 0   busPIRone (BUSPAR) 15 MG tablet Take 1 tablet (15 mg total) by mouth 2 (two) times daily. 180 tablet 1   lamoTRIgine (LAMICTAL) 200 MG tablet Take 1 tablet (200 mg total) by mouth daily. 90 tablet 1   Oxcarbazepine (TRILEPTAL) 300 MG tablet Take 1.5 tablets (450 mg total) by mouth at bedtime. 135 tablet 1   No current facility-administered medications for this visit.    Medication Side Effects: None  Allergies: No Known Allergies  Past Medical History:  Diagnosis Date   Asthma     Past Medical History, Surgical history, Social history, and Family history were reviewed and updated as appropriate.   Please see review of systems for further details on the patient's review from today.   Objective:   Physical Exam:  BP (!) 136/92   Pulse 77   Physical Exam Constitutional:      General: She is not in acute distress. Musculoskeletal:        General: No deformity.  Neurological:     Mental Status: She is alert and oriented to person, place, and time.     Coordination: Coordination normal.  Psychiatric:        Attention and Perception: Attention and  perception normal. She does not perceive auditory or visual hallucinations.        Mood and Affect: Mood normal. Mood is not anxious or depressed. Affect is not labile, blunt, angry or inappropriate.        Speech: Speech normal.        Behavior: Behavior normal.        Thought Content: Thought content normal. Thought content is not paranoid or delusional. Thought content does not include homicidal or suicidal ideation. Thought content does not include homicidal or suicidal plan.        Cognition and Memory:  Cognition and memory normal.        Judgment: Judgment normal.     Comments: Insight intact     Lab Review:     Component Value Date/Time   NA 140 07/24/2019 1841   K 3.9 07/24/2019 1841   CL 106 07/24/2019 1841   CO2 24 07/24/2019 1841   GLUCOSE 81 07/24/2019 1841   BUN 7 07/24/2019 1841   CREATININE 0.69 07/24/2019 1841   CALCIUM 9.5 07/24/2019 1841   PROT 7.3 07/24/2019 1841   ALBUMIN 4.2 07/24/2019 1841   AST 18 07/24/2019 1841   ALT 16 07/24/2019 1841   ALKPHOS 45 07/24/2019 1841   BILITOT 0.5 07/24/2019 1841   GFRNONAA >60 07/24/2019 1841   GFRAA >60 07/24/2019 1841       Component Value Date/Time   WBC 7.4 07/24/2019 1841   RBC 4.34 07/24/2019 1841   HGB 14.3 07/24/2019 1841   HCT 42.1 07/24/2019 1841   PLT 358 07/24/2019 1841   MCV 97.0 07/24/2019 1841   MCH 32.9 07/24/2019 1841   MCHC 34.0 07/24/2019 1841   RDW 12.5 07/24/2019 1841   LYMPHSABS 2.6 07/24/2019 1841   MONOABS 0.5 07/24/2019 1841   EOSABS 0.1 07/24/2019 1841   BASOSABS 0.1 07/24/2019 1841    No results found for: "POCLITH", "LITHIUM"   No results found for: "PHENYTOIN", "PHENOBARB", "VALPROATE", "CBMZ"   .res Assessment: Plan:   Will continue current plan of care since target signs and symptoms are well controlled without any tolerability issues. Discussed that BP was slightly elevated today and recommended periodically monitoring it at home or at work if BP cuff is available. She reports that she was running late to apt, rushing, has had caffeine, and took Adderall a couple of hours prior and that all of these factors may have caused BP reading today to be higher than usual. Will continue Adderall XR 15 mg po qd for ADHD.  Continue Buspar 15 mg po BID for anxiety.  Continue Lamictal 200 mg po qd for mood s/s.  Continue Trileptal 450 mg po QHS for mood s/s.  Pt to follow-up in 3 months or sooner if clinically indicated.  Patient advised to contact office with any questions, adverse  effects, or acute worsening in signs and symptoms.   Tiffany Terry was seen today for follow-up.  Diagnoses and all orders for this visit:  Attention deficit hyperactivity disorder (ADHD), combined type -     amphetamine-dextroamphetamine (ADDERALL XR) 15 MG 24 hr capsule; Take 1 capsule by mouth every morning. -     amphetamine-dextroamphetamine (ADDERALL XR) 15 MG 24 hr capsule; Take 1 capsule by mouth every morning. -     amphetamine-dextroamphetamine (ADDERALL XR) 15 MG 24 hr capsule; Take 1 capsule by mouth every morning.  Adjustment disorder with mixed anxiety and depressed mood -     busPIRone (BUSPAR) 15 MG tablet; Take 1  tablet (15 mg total) by mouth 2 (two) times daily. -     lamoTRIgine (LAMICTAL) 200 MG tablet; Take 1 tablet (200 mg total) by mouth daily.  PMDD (premenstrual dysphoric disorder) -     lamoTRIgine (LAMICTAL) 200 MG tablet; Take 1 tablet (200 mg total) by mouth daily. -     Oxcarbazepine (TRILEPTAL) 300 MG tablet; Take 1.5 tablets (450 mg total) by mouth at bedtime.     Please see After Visit Summary for patient specific instructions.  Future Appointments  Date Time Provider Department Center  09/02/2022 10:00 AM Corie Chiquito, PMHNP [facility] None    No orders of the defined types were placed in this encounter.   -------------------------------

## 2022-09-02 ENCOUNTER — Ambulatory Visit (INDEPENDENT_AMBULATORY_CARE_PROVIDER_SITE_OTHER): Payer: BC Managed Care – PPO | Admitting: Psychiatry

## 2022-09-02 ENCOUNTER — Encounter: Payer: Self-pay | Admitting: Psychiatry

## 2022-09-02 VITALS — BP 115/84 | HR 93

## 2022-09-02 DIAGNOSIS — F1090 Alcohol use, unspecified, uncomplicated: Secondary | ICD-10-CM | POA: Diagnosis not present

## 2022-09-02 DIAGNOSIS — F4323 Adjustment disorder with mixed anxiety and depressed mood: Secondary | ICD-10-CM

## 2022-09-02 DIAGNOSIS — F3281 Premenstrual dysphoric disorder: Secondary | ICD-10-CM

## 2022-09-02 DIAGNOSIS — F902 Attention-deficit hyperactivity disorder, combined type: Secondary | ICD-10-CM | POA: Diagnosis not present

## 2022-09-02 MED ORDER — ACAMPROSATE CALCIUM 333 MG PO TBEC: 666.0000 mg | DELAYED_RELEASE_TABLET | Freq: Three times a day (TID) | ORAL | 2 refills | Status: DC

## 2022-09-02 MED ORDER — LAMOTRIGINE 200 MG PO TABS: 200.0000 mg | ORAL_TABLET | Freq: Every day | ORAL | 1 refills | Status: DC

## 2022-09-02 MED ORDER — AMPHETAMINE-DEXTROAMPHET ER 15 MG PO CP24: 15.0000 mg | ORAL_CAPSULE | ORAL | 0 refills | Status: DC

## 2022-09-02 MED ORDER — BUSPIRONE HCL 15 MG PO TABS: 15.0000 mg | ORAL_TABLET | Freq: Two times a day (BID) | ORAL | 1 refills | Status: DC

## 2022-09-02 NOTE — Progress Notes (Unsigned)
Tiffany Terry [id number] 1990-10-12 32 y.o.  Subjective:   Patient ID:  Tiffany Terry is a 32 y.o. (DOB 1991-05-11) female.  Chief Complaint:  Chief Complaint  Patient presents with   Follow-up    ADHD, Anxiety, and mood disturbance    HPI Tiffany Terry presents to the office today for follow-up of anxiety, mood disturbance, and ADHD. Increased volume at work with end of the year and start of new year. Some anxiety "coming and going" in response to some stressors at work and relationship stressors. Had a panic attack recently and was able to work through this. Reports that anxiety has been manageable. Some sad mood at times. Denies persistent sad mood. Reports some irritability at times in response to stress. Sleeping ok and is occ disrupted by room getting warm. Appetite has been ok. Appetite is slightly decreased with Adderall. Energy and motivation have been good overall.  Concentration "comes and goes." Notices concentration is improved with taking regular breaks. Denies SI.   Noticed some PMDD s/s during one menstrual cycle and has had menstrual cycles since without mood s/s.   Has set goal/New Year's resolution to stop drinking entirely. Reports going a 3 week period without alcohol.   Adderall last filled 08/10/22.  Past Psychiatric Medication Trials: Wellbutrin- Adverse reaction. Had dissociation and panic.  Sertraline- Ineffective, no side effects. No worsening anxiety, irritability, or insomnia Concerta Vyvanse- Effective- Minimally effective Adzenys XR Adderall XR Trileptal    PHQ2-9    Flowsheet Row Office Visit from 07/10/2014 in [facility]  PHQ-2 Total Score 0        Review of Systems:  Review of Systems  Gastrointestinal: Negative.   Musculoskeletal:  Negative for gait problem.  Neurological:  Negative for dizziness and tremors.  Psychiatric/Behavioral:         Please refer to HPI    Medications: I have reviewed the patient's current  medications.  Current Outpatient Medications  Medication Sig Dispense Refill   albuterol (VENTOLIN HFA) 108 (90 Base) MCG/ACT inhaler Inhale 1-2 puffs into the lungs every 6 (six) hours as needed for wheezing or shortness of breath. 1 each 1   amphetamine-dextroamphetamine (ADDERALL XR) 15 MG 24 hr capsule Take 1 capsule by mouth every morning. 30 capsule 0   amphetamine-dextroamphetamine (ADDERALL XR) 15 MG 24 hr capsule Take 1 capsule by mouth every morning. 30 capsule 0   amphetamine-dextroamphetamine (ADDERALL XR) 15 MG 24 hr capsule Take 1 capsule by mouth every morning. 30 capsule 0   busPIRone (BUSPAR) 15 MG tablet Take 1 tablet (15 mg total) by mouth 2 (two) times daily. 180 tablet 1   cetirizine (ZYRTEC) 10 MG tablet Take 10 mg by mouth daily as needed.     lamoTRIgine (LAMICTAL) 200 MG tablet Take 1 tablet (200 mg total) by mouth daily. 90 tablet 1   Oxcarbazepine (TRILEPTAL) 300 MG tablet Take 1.5 tablets (450 mg total) by mouth at bedtime. 135 tablet 1   No current facility-administered medications for this visit.    Medication Side Effects: Slight decrease in appetite.   Allergies: No Known Allergies  Past Medical History:  Diagnosis Date   Asthma     Past Medical History, Surgical history, Social history, and Family history were reviewed and updated as appropriate.   Please see review of systems for further details on the patient's review from today.   Objective:   Physical Exam:  There were no vitals taken for this visit.  Physical  Exam Constitutional:      General: She is not in acute distress. Musculoskeletal:        General: No deformity.  Neurological:     Mental Status: She is alert and oriented to person, place, and time.     Coordination: Coordination normal.  Psychiatric:        Attention and Perception: Attention and perception normal. She does not perceive auditory or visual hallucinations.        Mood and Affect: Mood normal. Mood is not anxious  or depressed. Affect is not labile, blunt, angry or inappropriate.        Speech: Speech normal.        Behavior: Behavior normal.        Thought Content: Thought content normal. Thought content is not paranoid or delusional. Thought content does not include homicidal or suicidal ideation. Thought content does not include homicidal or suicidal plan.        Cognition and Memory: Cognition and memory normal.        Judgment: Judgment normal.     Comments: Insight intact     Lab Review:     Component Value Date/Time   NA 140 07/24/2019 1841   K 3.9 07/24/2019 1841   CL 106 07/24/2019 1841   CO2 24 07/24/2019 1841   GLUCOSE 81 07/24/2019 1841   BUN 7 07/24/2019 1841   CREATININE 0.69 07/24/2019 1841   CALCIUM 9.5 07/24/2019 1841   PROT 7.3 07/24/2019 1841   ALBUMIN 4.2 07/24/2019 1841   AST 18 07/24/2019 1841   ALT 16 07/24/2019 1841   ALKPHOS 45 07/24/2019 1841   BILITOT 0.5 07/24/2019 1841   GFRNONAA >60 07/24/2019 1841   GFRAA >60 07/24/2019 1841       Component Value Date/Time   WBC 7.4 07/24/2019 1841   RBC 4.34 07/24/2019 1841   HGB 14.3 07/24/2019 1841   HCT 42.1 07/24/2019 1841   PLT 358 07/24/2019 1841   MCV 97.0 07/24/2019 1841   MCH 32.9 07/24/2019 1841   MCHC 34.0 07/24/2019 1841   RDW 12.5 07/24/2019 1841   LYMPHSABS 2.6 07/24/2019 1841   MONOABS 0.5 07/24/2019 1841   EOSABS 0.1 07/24/2019 1841   BASOSABS 0.1 07/24/2019 1841    No results found for: "POCLITH", "LITHIUM"   No results found for: "PHENYTOIN", "PHENOBARB", "VALPROATE", "CBMZ"   .res Assessment: Plan:    There are no diagnoses linked to this encounter.   Please see After Visit Summary for patient specific instructions.  No future appointments.  No orders of the defined types were placed in this encounter.   -------------------------------

## 2022-12-09 ENCOUNTER — Ambulatory Visit (INDEPENDENT_AMBULATORY_CARE_PROVIDER_SITE_OTHER): Payer: BC Managed Care – PPO | Admitting: Psychiatry

## 2022-12-09 ENCOUNTER — Encounter: Payer: Self-pay | Admitting: Psychiatry

## 2022-12-09 VITALS — BP 102/79 | HR 73

## 2022-12-09 DIAGNOSIS — G4700 Insomnia, unspecified: Secondary | ICD-10-CM | POA: Diagnosis not present

## 2022-12-09 DIAGNOSIS — F4323 Adjustment disorder with mixed anxiety and depressed mood: Secondary | ICD-10-CM | POA: Diagnosis not present

## 2022-12-09 DIAGNOSIS — F902 Attention-deficit hyperactivity disorder, combined type: Secondary | ICD-10-CM

## 2022-12-09 DIAGNOSIS — F3281 Premenstrual dysphoric disorder: Secondary | ICD-10-CM

## 2022-12-09 MED ORDER — BUSPIRONE HCL 15 MG PO TABS: 15.0000 mg | ORAL_TABLET | Freq: Two times a day (BID) | ORAL | 1 refills | Status: DC

## 2022-12-09 MED ORDER — TRAZODONE HCL 100 MG PO TABS: ORAL_TABLET | ORAL | 2 refills | Status: AC

## 2022-12-09 MED ORDER — LAMOTRIGINE 200 MG PO TABS: 200.0000 mg | ORAL_TABLET | Freq: Every day | ORAL | 1 refills | Status: DC

## 2022-12-09 MED ORDER — AMPHETAMINE-DEXTROAMPHET ER 15 MG PO CP24: 15.0000 mg | ORAL_CAPSULE | ORAL | 0 refills | Status: DC

## 2022-12-09 MED ORDER — OXCARBAZEPINE 300 MG PO TABS: 450.0000 mg | ORAL_TABLET | Freq: Every day | ORAL | 1 refills | Status: DC

## 2022-12-09 MED ORDER — AMPHETAMINE-DEXTROAMPHET ER 15 MG PO CP24: 15.0000 mg | ORAL_CAPSULE | ORAL | 0 refills | Status: AC

## 2022-12-09 NOTE — Progress Notes (Signed)
Tiffany Terry [id number] 07-06-1991 31 y.o.  Subjective:   Patient ID:  Tiffany Terry is a 32 y.o. (DOB 01-24-1991) female.  Chief Complaint:  Chief Complaint  Patient presents with   Insomnia    HPI Tiffany Terry presents to the office today for follow-up of mood disturbance, anxiety, and ADHD. She reports difficulty with sleep initiation and "getting my body ready to lie down... body is not tired." Reports that some nights she is not able to fall asleep until 2- 4 am. Has to be at work at 10 am. Occ sleepless nights. Denies any change in mood or behavior on those nights. She reports that she has had some busy and stressful days at work and thinks she may have more trouble with sleep on those days. She has had some increased anxiety at work in response to busyness of work. She reports that she cried on a few occasions in response to stress. Denies panic attacks. Denies SI.  Denies elevated mood. Some intermittent mild depression. She reports that her energy and motivation have been "all over the place." Reports napping 1-2 times a day on the weekend. Will sleep longer on weekends. Appetite has been "not great." She reports that sometimes she will notice hunger when Adderall wears off. She reports that her concentration is "ok." Takes 3 breaks a day after 2 hours of work.   Has not drank in 3 months. She reports that she has had ETOH cravings on a few occasions.   Reports that she has tried Magnesium and had some excessive somnolence.   Adderall XR last filled 11/24/22,  Past Psychiatric Medication Trials: Wellbutrin- Adverse reaction. Had dissociation and panic.  Sertraline- Ineffective, no side effects. No worsening anxiety, irritability, or insomnia Concerta Vyvanse- Effective- Minimally effective Adzenys XR Adderall XR Trileptal  PHQ2-9    Flowsheet Row Office Visit from 07/10/2014 in [facility]  PHQ-2 Total Score 0        Review of Systems:  Review of  Systems  HENT:  Positive for dental problem.   Cardiovascular:  Negative for palpitations.  Musculoskeletal:  Negative for gait problem.  Psychiatric/Behavioral:         Please refer to HPI    Medications: I have reviewed the patient's current medications.  Current Outpatient Medications  Medication Sig Dispense Refill   [START ON 01/19/2023] amphetamine-dextroamphetamine (ADDERALL XR) 15 MG 24 hr capsule Take 1 capsule by mouth every morning. 30 capsule 0   cetirizine (ZYRTEC) 10 MG tablet Take 10 mg by mouth daily as needed.     MAGNESIUM PO Take by mouth.     traZODone (DESYREL) 100 MG tablet Take 1/2-1 tablet po QHS prn insomnia 30 tablet 2   albuterol (VENTOLIN HFA) 108 (90 Base) MCG/ACT inhaler Inhale 1-2 puffs into the lungs every 6 (six) hours as needed for wheezing or shortness of breath. 1 each 1   amphetamine-dextroamphetamine (ADDERALL XR) 15 MG 24 hr capsule Take 1 capsule by mouth every morning. 30 capsule 0   [START ON 03/16/2023] amphetamine-dextroamphetamine (ADDERALL XR) 15 MG 24 hr capsule Take 1 capsule by mouth every morning. 30 capsule 0   [START ON 02/16/2023] amphetamine-dextroamphetamine (ADDERALL XR) 15 MG 24 hr capsule Take 1 capsule by mouth every morning. 30 capsule 0   busPIRone (BUSPAR) 15 MG tablet Take 1 tablet (15 mg total) by mouth 2 (two) times daily. 180 tablet 1   lamoTRIgine (LAMICTAL) 200 MG tablet Take 1 tablet (200 mg  total) by mouth daily. 90 tablet 1   Oxcarbazepine (TRILEPTAL) 300 MG tablet Take 1.5 tablets (450 mg total) by mouth at bedtime. 135 tablet 1   No current facility-administered medications for this visit.    Medication Side Effects: Other: Possible sleep disturbance  Allergies: No Known Allergies  Past Medical History:  Diagnosis Date   Asthma     Past Medical History, Surgical history, Social history, and Family history were reviewed and updated as appropriate.   Please see review of systems for further details on the  patient's review from today.   Objective:   Physical Exam:  BP 102/79   Pulse 73   Physical Exam Constitutional:      General: She is not in acute distress. Musculoskeletal:        General: No deformity.  Neurological:     Mental Status: She is alert and oriented to person, place, and time.     Coordination: Coordination normal.  Psychiatric:        Attention and Perception: Attention and perception normal. She does not perceive auditory or visual hallucinations.        Mood and Affect: Mood normal. Mood is not anxious or depressed. Affect is not labile, blunt, angry or inappropriate.        Speech: Speech normal.        Behavior: Behavior normal.        Thought Content: Thought content normal. Thought content is not paranoid or delusional. Thought content does not include homicidal or suicidal ideation. Thought content does not include homicidal or suicidal plan.        Cognition and Memory: Cognition and memory normal.        Judgment: Judgment normal.     Comments: Insight intact     Lab Review:     Component Value Date/Time   NA 140 07/24/2019 1841   K 3.9 07/24/2019 1841   CL 106 07/24/2019 1841   CO2 24 07/24/2019 1841   GLUCOSE 81 07/24/2019 1841   BUN 7 07/24/2019 1841   CREATININE 0.69 07/24/2019 1841   CALCIUM 9.5 07/24/2019 1841   PROT 7.3 07/24/2019 1841   ALBUMIN 4.2 07/24/2019 1841   AST 18 07/24/2019 1841   ALT 16 07/24/2019 1841   ALKPHOS 45 07/24/2019 1841   BILITOT 0.5 07/24/2019 1841   GFRNONAA >60 07/24/2019 1841   GFRAA >60 07/24/2019 1841       Component Value Date/Time   WBC 7.4 07/24/2019 1841   RBC 4.34 07/24/2019 1841   HGB 14.3 07/24/2019 1841   HCT 42.1 07/24/2019 1841   PLT 358 07/24/2019 1841   MCV 97.0 07/24/2019 1841   MCH 32.9 07/24/2019 1841   MCHC 34.0 07/24/2019 1841   RDW 12.5 07/24/2019 1841   LYMPHSABS 2.6 07/24/2019 1841   MONOABS 0.5 07/24/2019 1841   EOSABS 0.1 07/24/2019 1841   BASOSABS 0.1 07/24/2019 1841     No results found for: "POCLITH", "LITHIUM"   No results found for: "PHENYTOIN", "PHENOBARB", "VALPROATE", "CBMZ"   .res Assessment: Plan:    Discussed potential benefits, risks, and side effects of Trazodone. Pt agrees to trial of Trazodone. Will start Trazodone 100 mg 1/2-1 tablet at bedtime as needed for insomnia.  Continue Trileptal 450 mg at bedtime for anxiety and mood stabilization.  Continue Lamictal 200 mg po qd for mood symptoms.  Continue Adderall XR 15 mg po q am for ADHD.  Continue Buspar 15 mg po BID for anxiety.  Pt to  follow-up in 3 months or sooner if clinically indicated.  Patient advised to contact office with any questions, adverse effects, or acute worsening in signs and symptoms.   Tiffany RuizLeah was seen today for insomnia.  Diagnoses and all orders for this visit:  Attention deficit hyperactivity disorder (ADHD), combined type -     amphetamine-dextroamphetamine (ADDERALL XR) 15 MG 24 hr capsule; Take 1 capsule by mouth every morning. -     amphetamine-dextroamphetamine (ADDERALL XR) 15 MG 24 hr capsule; Take 1 capsule by mouth every morning. -     amphetamine-dextroamphetamine (ADDERALL XR) 15 MG 24 hr capsule; Take 1 capsule by mouth every morning.  Insomnia, unspecified type -     traZODone (DESYREL) 100 MG tablet; Take 1/2-1 tablet po QHS prn insomnia  PMDD (premenstrual dysphoric disorder) -     Oxcarbazepine (TRILEPTAL) 300 MG tablet; Take 1.5 tablets (450 mg total) by mouth at bedtime. -     lamoTRIgine (LAMICTAL) 200 MG tablet; Take 1 tablet (200 mg total) by mouth daily.  Adjustment disorder with mixed anxiety and depressed mood -     lamoTRIgine (LAMICTAL) 200 MG tablet; Take 1 tablet (200 mg total) by mouth daily. -     busPIRone (BUSPAR) 15 MG tablet; Take 1 tablet (15 mg total) by mouth 2 (two) times daily.     Please see After Visit Summary for patient specific instructions.  Future Appointments  Date Time Provider Department Center   03/11/2023 10:00 AM Corie Chiquitoarter, Ragnar Waas, PMHNP [facility] None    No orders of the defined types were placed in this encounter.   -------------------------------

## 2023-03-11 ENCOUNTER — Ambulatory Visit: Payer: BC Managed Care – PPO | Admitting: Psychiatry

## 2023-03-25 ENCOUNTER — Ambulatory Visit: Payer: BC Managed Care – PPO | Admitting: Psychiatry

## 2023-05-04 ENCOUNTER — Encounter (HOSPITAL_COMMUNITY): Payer: Self-pay | Admitting: Emergency Medicine

## 2023-05-04 ENCOUNTER — Other Ambulatory Visit: Payer: Self-pay

## 2023-05-04 ENCOUNTER — Emergency Department (HOSPITAL_COMMUNITY): Payer: BC Managed Care – PPO

## 2023-05-04 ENCOUNTER — Emergency Department (HOSPITAL_COMMUNITY)
Admission: EM | Admit: 2023-05-04 | Discharge: 2023-05-05 | Disposition: A | Discharge status: Home / Self Care | Payer: BC Managed Care – PPO | Attending: Emergency Medicine | Admitting: Emergency Medicine

## 2023-05-04 DIAGNOSIS — H532 Diplopia: Secondary | ICD-10-CM | POA: Diagnosis present

## 2023-05-04 LAB — URINALYSIS, ROUTINE W REFLEX MICROSCOPIC
Bilirubin Urine: NEGATIVE
Glucose, UA: NEGATIVE mg/dL
Hgb urine dipstick: NEGATIVE
Ketones, ur: 5 mg/dL — AB
Nitrite: NEGATIVE
Protein, ur: NEGATIVE mg/dL
Specific Gravity, Urine: 1.018 (ref 1.005–1.030)
pH: 6 (ref 5.0–8.0)

## 2023-05-04 LAB — COMPREHENSIVE METABOLIC PANEL
ALT: 23 U/L (ref 0–44)
AST: 20 U/L (ref 15–41)
Albumin: 4.1 g/dL (ref 3.5–5.0)
Alkaline Phosphatase: 40 U/L (ref 38–126)
Anion gap: 9 (ref 5–15)
BUN: 6 mg/dL (ref 6–20)
CO2: 25 mmol/L (ref 22–32)
Calcium: 9.3 mg/dL (ref 8.9–10.3)
Chloride: 105 mmol/L (ref 98–111)
Creatinine, Ser: 0.89 mg/dL (ref 0.44–1.00)
GFR, Estimated: >60 mL/min (ref >60)
Glucose, Bld: 131 mg/dL — ABNORMAL HIGH (ref 70–99)
Potassium: 3.8 mmol/L (ref 3.5–5.1)
Sodium: 139 mmol/L (ref 135–145)
Total Bilirubin: 0.3 mg/dL (ref 0.3–1.2)
Total Protein: 6.8 g/dL (ref 6.5–8.1)

## 2023-05-04 LAB — CBC
HCT: 39.4 % (ref 36.0–46.0)
Hemoglobin: 12.7 g/dL (ref 12.0–15.0)
MCH: 30.1 pg (ref 26.0–34.0)
MCHC: 32.2 g/dL (ref 30.0–36.0)
MCV: 93.4 fL (ref 80.0–100.0)
Platelets: 337 10*3/uL (ref 150–400)
RBC: 4.22 MIL/uL (ref 3.87–5.11)
RDW: 13.1 % (ref 11.5–15.5)
WBC: 5 10*3/uL (ref 4.0–10.5)
nRBC: 0 % (ref 0.0–0.2)

## 2023-05-04 LAB — HCG, SERUM, QUALITATIVE: Preg, Serum: NEGATIVE

## 2023-05-04 MED ORDER — GADOBUTROL 1 MMOL/ML IV SOLN
7.0000 mL | Freq: Once | INTRAVENOUS | Status: AC | PRN
  Administered 2023-05-04: 7 mL via INTRAVENOUS

## 2023-05-04 NOTE — ED Provider Notes (Signed)
EMERGENCY DEPARTMENT AT [facility] Provider Note   CSN: [id number] Arrival date & time: 05/04/23  1857     History  Chief Complaint  Patient presents with   Eye Problem    Tiffany Terry is a 32 y.o. female.   Eye Problem  This is a 32 year old female history of ADHD presenting to the ED due to concerns of double vision this morning.  Said her last episode was around May June this year but what made her come to the ED is due to an associated headache that she did not experience during her previous episode in June.  Patient works with an optometrist who saw her after today's episode and told her to come to the ED for further work up.  Patient reports that she has been seen to diagonally displaced images with one  higher than the other   Home Medications Prior to Admission medications   Medication Sig Start Date End Date Taking? Authorizing Provider  albuterol (VENTOLIN HFA) 108 (90 Base) MCG/ACT inhaler Inhale 1-2 puffs into the lungs every 6 (six) hours as needed for wheezing or shortness of breath. 10/09/21   Corie Chiquito, PMHNP  amphetamine-dextroamphetamine (ADDERALL XR) 15 MG 24 hr capsule Take 1 capsule by mouth every morning. 11/02/22   Corie Chiquito, PMHNP  amphetamine-dextroamphetamine (ADDERALL XR) 15 MG 24 hr capsule Take 1 capsule by mouth every morning. 03/16/23   Corie Chiquito, PMHNP  amphetamine-dextroamphetamine (ADDERALL XR) 15 MG 24 hr capsule Take 1 capsule by mouth every morning. 02/16/23   Corie Chiquito, PMHNP  amphetamine-dextroamphetamine (ADDERALL XR) 15 MG 24 hr capsule Take 1 capsule by mouth every morning. 01/19/23   Corie Chiquito, PMHNP  busPIRone (BUSPAR) 15 MG tablet Take 1 tablet (15 mg total) by mouth 2 (two) times daily. 12/09/22 03/09/23  Corie Chiquito, PMHNP  cetirizine (ZYRTEC) 10 MG tablet Take 10 mg by mouth daily as needed.    [provider]  lamoTRIgine (LAMICTAL) 200 MG tablet Take 1 tablet (200 mg total) by  mouth daily. 12/09/22 06/07/23  Corie Chiquito, PMHNP  MAGNESIUM PO Take by mouth.    [provider]  Oxcarbazepine (TRILEPTAL) 300 MG tablet Take 1.5 tablets (450 mg total) by mouth at bedtime. 12/09/22 03/09/23  Corie Chiquito, PMHNP  traZODone (DESYREL) 100 MG tablet Take 1/2-1 tablet po QHS prn insomnia 12/09/22   Corie Chiquito, PMHNP      Allergies    Patient has no known allergies.    Review of Systems   Review of Systems  Physical Exam Updated Vital Signs BP (!) 162/104   Pulse 85   Temp 98.4 F (36.9 C) (Oral)   Resp 18   Ht 5\' 3"  (1.6 m)   Wt 68 kg   SpO2 95%   BMI 26.57 kg/m  Physical Exam Constitutional:      Appearance: Normal appearance.  HENT:     Head: Normocephalic and atraumatic.     Comments: Eyes are reactive to light No signs of trauma,no discharge  Neck:     Vascular: No carotid bruit.  Cardiovascular:     Rate and Rhythm: Normal rate and regular rhythm.  Musculoskeletal:     Cervical back: Normal range of motion. No rigidity or tenderness.  Lymphadenopathy:     Cervical: No cervical adenopathy.  Neurological:     Mental Status: She is alert.     ED Results / Procedures / Treatments   Labs (all labs ordered are listed, but only  abnormal results are displayed) Labs Reviewed  COMPREHENSIVE METABOLIC PANEL - Abnormal; Notable for the following components:      Result Value   Glucose, Bld 131 (*)    All other components within normal limits  URINALYSIS, ROUTINE W REFLEX MICROSCOPIC - Abnormal; Notable for the following components:   APPearance CLOUDY (*)    Ketones, ur 5 (*)    Leukocytes,Ua TRACE (*)    Bacteria, UA RARE (*)    All other components within normal limits  CBC  HCG, SERUM, QUALITATIVE    EKG None  Radiology No results found.  Procedures Procedures    Medications Ordered in ED Medications - No data to display  ED Course/ Medical Decision Making/ A&P Clinical Course as of 05/04/23 2153  Tue May 04, 2023  2107 Stable (Resident)  30 YOF with chief complaint of diploplia (10-11 this AM) 2nd episode. Resolved. Works with optometrists as a Medical sales representative. Had a headache with the episode.   [CC]    Clinical Course User Index [CC] Glyn Ade, MD                                 Medical Decision Making Amount and/or Complexity of Data Reviewed Labs: ordered.    Details: CBC, CMP, hCG   Vertical Diplopia  This patient is a 32 y.o. female who presents to the ED for concern of double vision said today is the second episode she has had in this year the first 1 was mid June resolved on it"s own.  Patient is reporting new onset of associated headaches during this episode for which an optometrist told her to come to the ED for further imaging and evaluation.  Patient describes these episode like seeing 2 diagonal images with one at a higher plan than the other., this involves an extensive number of treatment options, and is a complaint that carries with it a high risk of complications and morbidity. The emergent differential diagnosis prior to evaluation includes, but is not limited to,intracranial mass,dural venus sinus thrombosis,migraine with aura and multiple sclerosis . This is not an exhaustive differential.   Physical Exam: Physical exam performed. The pertinent findings include: Recreation of double vision with ocular maneuver      Disposition: After consideration of the diagnostic results and the patients response to treatment.I ordered imaging studies including MRI head. If MRI is negative, she can be discharged . I sign out to Dr Cristal Deer A who will be following up with her MRI.   I discussed this case with my attending physician Dr. Doran Durand who cosigned this note including patient's presenting symptoms, physical exam, and planned diagnostics and interventions. Attending physician stated agreement with plan or made changes to plan which were implemented.            Final Clinical Impression(s) / ED Diagnoses Final diagnoses:  None    Rx / DC Orders ED Discharge Orders     None         Kathleen Lime, MD 05/04/23 2312    Glyn Ade, MD 05/04/23 2334

## 2023-05-04 NOTE — ED Notes (Signed)
Patient transported to MRI 

## 2023-05-04 NOTE — ED Triage Notes (Signed)
Patient coming to ED for evaluation of double vision.  Reports sudden onset in symptoms today.  Was seen by eye doctor and referred here for further follow up.  Has mild HA.  No hx of hypertension.  No reports of weakness or slurred speech.

## 2023-05-04 NOTE — ED Notes (Signed)
Visual acuity  L-20/13     R-20/13 Pt state she no longer have double vision at this time

## 2023-05-05 NOTE — Discharge Instructions (Signed)
The MRI did not show any serious problem.  There is no has not an obvious cause for your visual disturbance, although it is likely secondary to headaches and migraines.  Following up with neurologist later this week will be appropriate.

## 2023-05-05 NOTE — ED Provider Notes (Signed)
Patient signed out to me by Dr. Doran Durand.  Patient has been seen earlier with visual disturbance, diplopia.  MRI was performed to further evaluate.  MRI with nonspecific changes but nothing acute that would explain her diplopia.  Patient has follow-up with neurology scheduled in 2 days, will be appropriate for discharge.   Gilda Crease, MD 05/05/23 [phone]

## 2023-05-05 NOTE — Progress Notes (Unsigned)
Initial neurology clinic note  Tiffany Terry MRN: [id number] DOB: 1990-12-31  Referring provider: Sena Hitch, OD  Primary care provider: Patient, No Pcp Per  Reason for consult:  Double vision  Subjective:  This is Tiffany Terry, a 32 y.o. ***-handed female with a medical history of ADHD, anxiety, and depression who presents to neurology clinic with double vision. The patient is accompanied by ***.  *** Acute onset diplopia on 05/04/23. She had another episode around 12/2022. She had a HA with 05/04/23 episode, but not 12/2022 episode. 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   amphetamine-dextroamphetamine (ADDERALL XR) 15 MG 24 hr capsule Take 1 capsule by mouth every morning.   amphetamine-dextroamphetamine (ADDERALL XR) 15 MG 24 hr capsule Take 1 capsule by mouth every morning.   amphetamine-dextroamphetamine (ADDERALL XR) 15 MG 24 hr capsule Take 1 capsule by mouth every morning.   amphetamine-dextroamphetamine (ADDERALL XR) 15 MG 24 hr capsule Take 1 capsule by mouth every morning.   busPIRone (BUSPAR) 15 MG tablet Take 1 tablet (15 mg total) by mouth 2 (two) times daily.   cetirizine (ZYRTEC) 10 MG tablet Take 10 mg by mouth daily as needed.   lamoTRIgine (LAMICTAL) 200 MG tablet Take 1 tablet (200 mg total) by mouth daily.   MAGNESIUM  PO Take by mouth.   Oxcarbazepine (TRILEPTAL) 300 MG tablet Take 1.5 tablets (450 mg total) by mouth at bedtime.   traZODone (DESYREL) 100 MG tablet Take 1/2-1 tablet po QHS prn insomnia   No facility-administered encounter medications on file as of 05/06/2023.    PAST MEDICAL HISTORY: Past Medical History:  Diagnosis Date   Asthma     PAST SURGICAL HISTORY: Past Surgical History:  Procedure Laterality Date   ANTERIOR CRUCIATE LIGAMENT (ACL) REVISION Left 2011   SHOULDER SURGERY Right     ALLERGIES: No Known Allergies  FAMILY HISTORY: Family History  Problem Relation Age of Onset   Healthy Mother    Depression Mother    Healthy Father    Depression Father    ADD / ADHD Father    Bipolar disorder Sister    Personality disorder Sister    Depression Brother    Anxiety disorder Brother    Mood Disorder Paternal Uncle    Alcohol abuse Maternal Grandfather     SOCIAL HISTORY: Social History   Tobacco Use   Smoking status: Former    Types: Cigarettes    Start date: 2010   Smokeless tobacco: Never   Tobacco comments:    3 cigarettes a day/smoked off and on  Vaping Use   Vaping status: Every Day  Substance Use Topics   Alcohol use: Yes   Drug use: Not Currently    Types: Marijuana    Comment: twice a month   Social History   Social History Narrative   Not on file  Objective:  Vital Signs:  There were no vitals taken for this visit.  ***  Labs and Imaging review: Internal labs: 05/04/23: CBC unremarkable CMP significant for glucose of 131 ***  External labs: ***  Imaging: MRI brain w/wo contrast (05/04/23): FINDINGS: Brain: Cerebral volume within normal limits. Few scattered subcentimeter foci of T2/FLAIR hyperintensity noted involving the supratentorial cerebral white matter, nonspecific, but mild in nature.   No evidence for acute or subacute ischemia. Gray-white matter differentiation maintained. No areas of chronic cortical infarction. No  acute or chronic intracranial blood products.   No mass lesion, midline shift or mass effect. No hydrocephalus or extra-axial fluid collection. Pituitary gland suprasellar region within normal limits.   No abnormal enhancement.   Vascular: Major intracranial vascular flow voids are maintained.   Skull and upper cervical spine: Craniocervical junction normal. Bone marrow signal intensity within normal limits. No scalp soft tissue abnormality.   Sinuses/Orbits: Globes orbital soft tissues within normal limits. Right larger than left maxillary sinus retention cyst noted. Paranasal sinuses are otherwise clear. No mastoid effusion.   Other: None.   IMPRESSION: 1. No acute intracranial abnormality. 2. Mild cerebral white matter disease. Findings are nonspecific, but most commonly related to chronic microvascular ischemic disease. Sequelae of migrainous disorder would be the primary differential consideration given history of headache.  Nasal/sinus endoscopy (06/28/20): Septum:    normal     Deviation: deviated to the right     Severity of deviation: mild   Sinus:    Right middle meatus: normal     inflammation and purulence     Left middle meatus: normal   Comments:     On nasal endoscopy patient had a mucopurulent discharge from the right  middle meatus with a clear left middle meatus.  ***  Assessment/Plan:  Tiffany Terry is a 32 y.o. female who presents for evaluation of ***. *** has a relevant medical history of ***. *** neurological examination is pertinent for ***. Available diagnostic data is significant for ***. This constellation of symptoms and objective data would most likely localize to ***. ***  PLAN: -Blood work: ***TSH, B12 ***  -Return to clinic ***  The impression above as well as the plan as outlined below were extensively discussed with the patient (in the company of ***) who voiced understanding. All questions were answered to their satisfaction.  The  patient was counseled on pertinent fall precautions per the printed material provided today, and as noted under the "Patient Instructions" section below.***  When available, results of the above investigations and possible further recommendations will be communicated to the patient via telephone/MyChart. Patient to call office if not contacted after expected testing turnaround time.   Total time spent reviewing records, interview, history/exam, documentation, and coordination of care on day of encounter:  *** min   Thank you for allowing me to participate in patient's care.  If I can answer any additional questions, I would be pleased to do so.  Jacquelyne Balint, MD   CC: Patient, No Pcp Per No address on file  CC: Referring provider: Sena Hitch, OD [address] [place],  [place] [address]

## 2023-05-06 ENCOUNTER — Encounter: Payer: Self-pay | Admitting: Neurology

## 2023-05-06 ENCOUNTER — Ambulatory Visit (INDEPENDENT_AMBULATORY_CARE_PROVIDER_SITE_OTHER): Payer: BC Managed Care – PPO | Admitting: Neurology

## 2023-05-06 ENCOUNTER — Other Ambulatory Visit (INDEPENDENT_AMBULATORY_CARE_PROVIDER_SITE_OTHER): Payer: BC Managed Care – PPO

## 2023-05-06 VITALS — BP 138/90 | HR 71 | Resp 97 | Ht 64.0 in | Wt 146.0 lb

## 2023-05-06 DIAGNOSIS — H532 Diplopia: Secondary | ICD-10-CM

## 2023-05-06 DIAGNOSIS — R519 Headache, unspecified: Secondary | ICD-10-CM

## 2023-05-06 DIAGNOSIS — R29818 Other symptoms and signs involving the nervous system: Secondary | ICD-10-CM

## 2023-05-06 LAB — VITAMIN B12: Vitamin B-12: 188 pg/mL — ABNORMAL LOW (ref 211–911)

## 2023-05-06 LAB — TSH: TSH: 0.99 u[IU]/mL (ref 0.35–5.50)

## 2023-05-06 NOTE — Patient Instructions (Signed)
I saw you today for double vision and headache. I am not sure the cause of your symptoms, but am suspicious for migraine. Your examination today is normal. Your MRI of the brain on 05/04/23 was also normal. This is reassuring.  I would like to get blood work today to look for possible treatable causes of your symptoms.  I would like to monitor your symptoms. You may try an over the counter medication such as ibuprofen, tylenol, or excedrin if you have symptoms again. This would be to see if symptoms respond to headache treatment.  I will be in touch when I have the results of your blood work. If you have new or worsening symptoms, please let me know. Otherwise, you can follow up with me as needed.  The physicians and staff at [facility] are committed to providing excellent care. You may receive a survey requesting feedback about your experience at our office. We strive to receive "very good" responses to the survey questions. If you feel that your experience would prevent you from giving the office a "very good " response, please contact our office to try to remedy the situation. We may be reached at [phone]. Thank you for taking the time out of your busy day to complete the survey.  Jacquelyne Balint, MD [facility]

## 2023-05-12 ENCOUNTER — Encounter: Payer: Self-pay | Admitting: Neurology

## 2023-05-16 LAB — VITAMIN B1: Vitamin B1 (Thiamine): 7 nmol/L — ABNORMAL LOW (ref 8–30)

## 2023-05-16 LAB — MYASTHENIA GRAVIS PANEL 2
A CHR BINDING ABS: <0.3 nmol/L
ACHR Blocking Abs: <15 %{inhibition} (ref <15)
Acetylchol Modul Ab: 8 %{inhibition}

## 2023-06-22 ENCOUNTER — Telehealth: Payer: Self-pay | Admitting: Psychiatry

## 2023-06-22 DIAGNOSIS — F3281 Premenstrual dysphoric disorder: Secondary | ICD-10-CM

## 2023-06-22 MED ORDER — OXCARBAZEPINE 300 MG PO TABS: 450.0000 mg | ORAL_TABLET | Freq: Every day | ORAL | 1 refills | Status: DC

## 2023-06-22 NOTE — Telephone Encounter (Signed)
sent 

## 2023-06-22 NOTE — Telephone Encounter (Signed)
Pt LVM @ 10:24a requesting refill of Oxcarbazepine to  [facility] # [address], [place] - [address] [place] [address] Phone: [phone]  Fax: [phone]   Next appt 11/7

## 2023-07-08 ENCOUNTER — Encounter: Payer: Self-pay | Admitting: Psychiatry

## 2023-07-08 ENCOUNTER — Ambulatory Visit (INDEPENDENT_AMBULATORY_CARE_PROVIDER_SITE_OTHER): Payer: BC Managed Care – PPO | Admitting: Psychiatry

## 2023-07-08 DIAGNOSIS — F902 Attention-deficit hyperactivity disorder, combined type: Secondary | ICD-10-CM

## 2023-07-08 DIAGNOSIS — F3281 Premenstrual dysphoric disorder: Secondary | ICD-10-CM

## 2023-07-08 DIAGNOSIS — F4323 Adjustment disorder with mixed anxiety and depressed mood: Secondary | ICD-10-CM

## 2023-07-08 DIAGNOSIS — G4700 Insomnia, unspecified: Secondary | ICD-10-CM | POA: Diagnosis not present

## 2023-07-08 MED ORDER — LAMOTRIGINE 200 MG PO TABS
200.0000 mg | ORAL_TABLET | Freq: Every day | ORAL | 1 refills | Status: AC
Start: 2023-07-08 — End: 2024-01-04

## 2023-07-08 MED ORDER — BUSPIRONE HCL 15 MG PO TABS: 15.0000 mg | ORAL_TABLET | Freq: Two times a day (BID) | ORAL | 1 refills | Status: AC

## 2023-07-08 MED ORDER — AMPHETAMINE-DEXTROAMPHET ER 15 MG PO CP24: 15.0000 mg | ORAL_CAPSULE | ORAL | 0 refills | Status: AC

## 2023-07-08 MED ORDER — OXCARBAZEPINE 300 MG PO TABS: 450.0000 mg | ORAL_TABLET | Freq: Every day | ORAL | 1 refills | Status: AC

## 2023-07-08 NOTE — Progress Notes (Signed)
SEQUITA WISE [id number] 24-Nov-1990 32 y.o.  Subjective:   Patient ID:  Tiffany Terry is a 32 y.o. (DOB [birth year]) adult.  Chief Complaint:  Chief Complaint  Patient presents with   Follow-up    Anxiety, mood disturbance, and ADHD    HPI Tiffany Terry presents to the office today for follow-up of anxiety, mood disturbance, and ADHD. Denies depressed mood. Anxiety has been "ok." Denies panic. Reports feeling, "surprisingly chill" even with multiple things happening at once. Sleeping ok and has more difficulty "going to bed" and turning phone off. Reports falling asleep within minutes of going to bed. Denies irritability. Energy and motivation are slightly low. Reports not taking Adderall daily. Appetite has been low, to include on days without Adderall. Concentration has been ok. Reports taking breaks every 2 hours and this is helpful for focus. Denies SI.   Reports PMDD symptoms have been "almost eliminated."  Has noticed about 30 lbs weight loss since limiting alcohol at the start of the year. Typically only drinks when on vacation.   Dog needed to go to vet. Enjoys dog and going for walks.   Has not needed Trazodone prn recently.  Adderall XR last filled 03/18/23. Did not take Adderall XR today.   Past Psychiatric Medication Trials: Wellbutrin- Adverse reaction. Had dissociation and panic.  Sertraline- Ineffective, no side effects. No worsening anxiety, irritability, or insomnia Concerta Vyvanse- Effective- Minimally effective Adzenys XR Adderall XR Trileptal  PHQ2-9    Flowsheet Row Office Visit from 07/10/2014 in [facility] [place]  PHQ-2 Total Score 0      Flowsheet Row ED from 05/04/2023 in [facility] Emergency Department at [facility]  C-SSRS RISK CATEGORY No Risk        Review of Systems:  Review of Systems  Eyes:        Had diplopia and saw ER and then neurology.   Musculoskeletal:  Negative for  gait problem.  Neurological:        Has been treated for migraines.   Psychiatric/Behavioral:         Please refer to HPI    Medications: I have reviewed the patient's current medications.  Current Outpatient Medications  Medication Sig Dispense Refill   albuterol (VENTOLIN HFA) 108 (90 Base) MCG/ACT inhaler Inhale 1-2 puffs into the lungs every 6 (six) hours as needed for wheezing or shortness of breath. 1 each 1   Cyanocobalamin (VITAMIN B 12 PO) Take by mouth.     thiamine (VITAMIN B-1) 100 MG tablet Take 100 mg by mouth daily.     amphetamine-dextroamphetamine (ADDERALL XR) 15 MG 24 hr capsule Take 1 capsule by mouth every morning. (Patient not taking: Reported on 05/06/2023) 30 capsule 0   amphetamine-dextroamphetamine (ADDERALL XR) 15 MG 24 hr capsule Take 1 capsule by mouth every morning. 30 capsule 0   [START ON 08/05/2023] amphetamine-dextroamphetamine (ADDERALL XR) 15 MG 24 hr capsule Take 1 capsule by mouth every morning. 30 capsule 0   [START ON 09/02/2023] amphetamine-dextroamphetamine (ADDERALL XR) 15 MG 24 hr capsule Take 1 capsule by mouth every morning. 30 capsule 0   busPIRone (BUSPAR) 15 MG tablet Take 1 tablet (15 mg total) by mouth 2 (two) times daily. 180 tablet 1   cetirizine (ZYRTEC) 10 MG tablet Take 10 mg by mouth daily as needed. (Patient not taking: Reported on 07/08/2023)     lamoTRIgine (LAMICTAL) 200 MG tablet Take 1 tablet (  200 mg total) by mouth daily. 90 tablet 1   MAGNESIUM PO Take by mouth. (Patient not taking: Reported on 05/06/2023)     Oxcarbazepine (TRILEPTAL) 300 MG tablet Take 1.5 tablets (450 mg total) by mouth at bedtime. 135 tablet 1   traZODone (DESYREL) 100 MG tablet Take 1/2-1 tablet po QHS prn insomnia (Patient not taking: Reported on 07/08/2023) 30 tablet 2   No current facility-administered medications for this visit.    Medication Side Effects: Appetite Suppression  Allergies: No Known Allergies  Past Medical History:  Diagnosis Date    Asthma     Past Medical History, Surgical history, Social history, and Family history were reviewed and updated as appropriate.   Please see review of systems for further details on the patient's review from today.   Objective:   Physical Exam:  BP (!) 130/93   Pulse 70   Physical Exam Constitutional:      General: Tiffany Terry is not in acute distress. Musculoskeletal:        General: No deformity.  Neurological:     Mental Status: Tiffany Terry is alert and oriented to person, place, and time.     Coordination: Coordination normal.  Psychiatric:        Attention and Perception: Attention and perception normal. Tiffany Terry does not perceive auditory or visual hallucinations.        Mood and Affect: Mood normal. Mood is not anxious or depressed. Affect is not labile, blunt, angry or inappropriate.        Speech: Speech normal.        Behavior: Behavior normal.        Thought Content: Thought content normal. Thought content is not paranoid or delusional. Thought content does not include homicidal or suicidal ideation. Thought content does not include homicidal or suicidal plan.        Cognition and Memory: Cognition and memory normal.        Judgment: Judgment normal.     Comments: Insight intact     Lab Review:     Component Value Date/Time   NA 139 05/04/2023 1941   K 3.8 05/04/2023 1941   CL 105 05/04/2023 1941   CO2 25 05/04/2023 1941   GLUCOSE 131 (H) 05/04/2023 1941   BUN 6 05/04/2023 1941   CREATININE 0.89 05/04/2023 1941   CALCIUM 9.3 05/04/2023 1941   PROT 6.8 05/04/2023 1941   ALBUMIN 4.1 05/04/2023 1941   AST 20 05/04/2023 1941   ALT 23 05/04/2023 1941   ALKPHOS 40 05/04/2023 1941   BILITOT 0.3 05/04/2023 1941   GFRNONAA >60 05/04/2023 1941   GFRAA >60 07/24/2019 1841       Component Value Date/Time   WBC 5.0 05/04/2023 1941   RBC 4.22 05/04/2023 1941   HGB 12.7 05/04/2023 1941   HCT 39.4 05/04/2023 1941   PLT 337 05/04/2023 1941   MCV 93.4 05/04/2023 1941   MCH 30.1  05/04/2023 1941   MCHC 32.2 05/04/2023 1941   RDW 13.1 05/04/2023 1941   LYMPHSABS 2.6 07/24/2019 1841   MONOABS 0.5 07/24/2019 1841   EOSABS 0.1 07/24/2019 1841   BASOSABS 0.1 07/24/2019 1841    No results found for: "POCLITH", "LITHIUM"   No results found for: "PHENYTOIN", "PHENOBARB", "VALPROATE", "CBMZ"   .res Assessment: Plan:   Recommended establishing care with PCP and monitoring BP since BP has been slightly elevated on several occasions. Discussed that Adderall XR can increase BP and dose may need to be reduced if BP remains  elevated.  Continue Buspar 15 mg po BID for anxiety.  Continue Lamictal 200 mg daily for mood symptoms.  Continue Adderall XR 15 mg daily.  Continue Trazodone 100 mg 1/2-1 tablet at bedtime as needed for insomnia. Pt denies needing a refill at this time.  Pt to follow-up in 3 months or sooner if clinically indicated.  Patient advised to contact office with any questions, adverse effects, or acute worsening in signs and symptoms.   Tiffany Terry was seen today for follow-up.  Diagnoses and all orders for this visit:  Attention deficit hyperactivity disorder (ADHD), combined type -     amphetamine-dextroamphetamine (ADDERALL XR) 15 MG 24 hr capsule; Take 1 capsule by mouth every morning. -     amphetamine-dextroamphetamine (ADDERALL XR) 15 MG 24 hr capsule; Take 1 capsule by mouth every morning. -     amphetamine-dextroamphetamine (ADDERALL XR) 15 MG 24 hr capsule; Take 1 capsule by mouth every morning.  Adjustment disorder with mixed anxiety and depressed mood -     busPIRone (BUSPAR) 15 MG tablet; Take 1 tablet (15 mg total) by mouth 2 (two) times daily. -     lamoTRIgine (LAMICTAL) 200 MG tablet; Take 1 tablet (200 mg total) by mouth daily.  PMDD (premenstrual dysphoric disorder) -     lamoTRIgine (LAMICTAL) 200 MG tablet; Take 1 tablet (200 mg total) by mouth daily. -     Oxcarbazepine (TRILEPTAL) 300 MG tablet; Take 1.5 tablets (450 mg total) by mouth at  bedtime.  Insomnia, unspecified type     Please see After Visit Summary for patient specific instructions.  No future appointments.  No orders of the defined types were placed in this encounter.   -------------------------------

## 2023-07-14 ENCOUNTER — Encounter: Payer: Self-pay | Admitting: Psychiatry

## 2023-11-11 NOTE — Telephone Encounter (Signed)
 Spoke with patient about her referral being place with Dermatology and to take the OTC Claritin. Patient was confused as what to do but after I repeat what I had said. Patient stated to this CMA that the Dermatology has already reached out and set up an appointment with the patient.

## 2023-11-11 NOTE — Telephone Encounter (Signed)
 Called the patient to clarify what all was going on with the medication. I talked to the patient to see what medication she was referring to. The provider had gave her an injection (Decadron) during office visit, medication of Medrol Dosepak that was finished up on 03/11 and hydroxyzine to take AS NEEDED for itching. The patient states she is itching all over and she wants to know what the provider advises?

## 2024-08-24 ENCOUNTER — Emergency Department (HOSPITAL_COMMUNITY)

## 2024-08-24 ENCOUNTER — Other Ambulatory Visit: Payer: Self-pay

## 2024-08-24 ENCOUNTER — Emergency Department (HOSPITAL_COMMUNITY)
Admission: EM | Admit: 2024-08-24 | Discharge: 2024-08-24 | Disposition: A | Discharge status: Home / Self Care | Attending: Emergency Medicine | Admitting: Emergency Medicine

## 2024-08-24 DIAGNOSIS — Z7951 Long term (current) use of inhaled steroids: Secondary | ICD-10-CM | POA: Insufficient documentation

## 2024-08-24 DIAGNOSIS — J45909 Unspecified asthma, uncomplicated: Secondary | ICD-10-CM | POA: Diagnosis not present

## 2024-08-24 DIAGNOSIS — R7309 Other abnormal glucose: Secondary | ICD-10-CM | POA: Diagnosis not present

## 2024-08-24 DIAGNOSIS — R0789 Other chest pain: Secondary | ICD-10-CM | POA: Diagnosis present

## 2024-08-24 LAB — CBC WITH DIFFERENTIAL/PLATELET
Abs Immature Granulocytes: 0.01 K/uL (ref 0.00–0.07)
Basophils Absolute: 0 K/uL (ref 0.0–0.1)
Basophils Relative: 1 %
Eosinophils Absolute: 0.1 K/uL (ref 0.0–0.5)
Eosinophils Relative: 1 %
HCT: 37.6 % (ref 36.0–46.0)
Hemoglobin: 12.3 g/dL (ref 12.0–15.0)
Immature Granulocytes: 0 %
Lymphocytes Relative: 38 %
Lymphs Abs: 2 K/uL (ref 0.7–4.0)
MCH: 30.6 pg (ref 26.0–34.0)
MCHC: 32.7 g/dL (ref 30.0–36.0)
MCV: 93.5 fL (ref 80.0–100.0)
Monocytes Absolute: 0.4 K/uL (ref 0.1–1.0)
Monocytes Relative: 8 %
Neutro Abs: 2.7 K/uL (ref 1.7–7.7)
Neutrophils Relative %: 52 %
Platelets: 339 K/uL (ref 150–400)
RBC: 4.02 MIL/uL (ref 3.87–5.11)
RDW: 13.3 % (ref 11.5–15.5)
WBC: 5.2 K/uL (ref 4.0–10.5)
nRBC: 0 % (ref 0.0–0.2)

## 2024-08-24 LAB — HCG, SERUM, QUALITATIVE: Preg, Serum: NEGATIVE

## 2024-08-24 LAB — BASIC METABOLIC PANEL WITH GFR
Anion gap: 10 (ref 5–15)
BUN: 9 mg/dL (ref 6–20)
CO2: 26 mmol/L (ref 22–32)
Calcium: 9.2 mg/dL (ref 8.9–10.3)
Chloride: 106 mmol/L (ref 98–111)
Creatinine, Ser: 0.73 mg/dL (ref 0.44–1.00)
GFR, Estimated: >60 mL/min
Glucose, Bld: 104 mg/dL — ABNORMAL HIGH (ref 70–99)
Potassium: 3.5 mmol/L (ref 3.5–5.1)
Sodium: 141 mmol/L (ref 135–145)

## 2024-08-24 LAB — TROPONIN T, HIGH SENSITIVITY
Troponin T High Sensitivity: <15 ng/L (ref 0–19)
Troponin T High Sensitivity: <15 ng/L (ref 0–19)

## 2024-08-24 LAB — D-DIMER, QUANTITATIVE: D-Dimer, Quant: <0.27 ug{FEU}/mL (ref 0.00–0.50)

## 2024-08-24 MED ORDER — ASPIRIN 81 MG PO CHEW
324.0000 mg | CHEWABLE_TABLET | Freq: Once | ORAL | Status: AC
  Administered 2024-08-24: 324 mg via ORAL
  Filled 2024-08-24: qty 4

## 2024-08-24 MED ORDER — NITROGLYCERIN 0.4 MG SL SUBL
0.4000 mg | SUBLINGUAL_TABLET | SUBLINGUAL | Status: DC | PRN
  Administered 2024-08-24: 0.4 mg via SUBLINGUAL
  Filled 2024-08-24: qty 1

## 2024-08-24 NOTE — ED Triage Notes (Signed)
 Pt c/o centralized chest pressure that started around 345 am pt felt chest pressure. Pain non-radiating. Associated with SOB, lightheadness, feeling shaky, and irregular heart rate. Sts HR monitor at home reading 40s to 100s.

## 2024-08-24 NOTE — ED Provider Notes (Signed)
 "  EMERGENCY DEPARTMENT AT [facility] Provider Note   CSN: [id number] Arrival date & time: 08/24/24  9546     Patient presents with: Chest Pain   Tiffany Terry is a 33 y.o. adult.   The history is provided by the patient.  Chest Pain  She has history of asthma and comes in because of a pressure feeling in her chest without radiation.  She has had some mild discomfort in her chest for the last several days.  Tonight, she was having difficulty sleeping and that sensation got significantly worse.  There is no associated dyspnea, nausea, diaphoresis.  Symptoms are sometimes worse with movement but not clearly with exertion.  She does not smoke but she does vape.  She has been told that her blood pressure has been high but never had a definite diagnosis of hypertension.  Denies history of diabetes or hyperlipidemia.  There is a family history of premature coronary atherosclerosis (father had heart disease with onset in his 74s).  She denies travel, surgery, exogenous estrogen, drug use.  Of note, she has been under emotional stress secondary to a death in the family.    Prior to Admission medications  Medication Sig Start Date End Date Taking? Authorizing Provider  albuterol  (VENTOLIN  HFA) 108 (90 Base) MCG/ACT inhaler Inhale 1-2 puffs into the lungs every 6 (six) hours as needed for wheezing or shortness of breath. 10/09/21   Franchot Harlene SQUIBB, PMHNP  amphetamine -dextroamphetamine (ADDERALL XR) 15 MG 24 hr capsule Take 1 capsule by mouth every morning. Patient not taking: Reported on 05/06/2023 01/19/23   Franchot Harlene SQUIBB, PMHNP  amphetamine -dextroamphetamine (ADDERALL XR) 15 MG 24 hr capsule Take 1 capsule by mouth every morning. 07/08/23   Franchot Harlene SQUIBB, PMHNP  amphetamine -dextroamphetamine (ADDERALL XR) 15 MG 24 hr capsule Take 1 capsule by mouth every morning. 08/05/23   Franchot Harlene SQUIBB, PMHNP  amphetamine -dextroamphetamine (ADDERALL XR) 15 MG 24 hr capsule Take 1  capsule by mouth every morning. 09/02/23   Franchot Harlene SQUIBB, PMHNP  busPIRone  (BUSPAR ) 15 MG tablet Take 1 tablet (15 mg total) by mouth 2 (two) times daily. 07/08/23 10/06/23  Franchot Harlene SQUIBB, PMHNP  cetirizine (ZYRTEC) 10 MG tablet Take 10 mg by mouth daily as needed. Patient not taking: Reported on 07/08/2023    [provider]  Cyanocobalamin  (VITAMIN B 12 PO) Take by mouth.    [provider]  lamoTRIgine  (LAMICTAL ) 200 MG tablet Take 1 tablet (200 mg total) by mouth daily. 07/08/23 01/04/24  Franchot Harlene SQUIBB, PMHNP  MAGNESIUM PO Take by mouth. Patient not taking: Reported on 05/06/2023    [provider]  Oxcarbazepine  (TRILEPTAL ) 300 MG tablet Take 1.5 tablets (450 mg total) by mouth at bedtime. 07/08/23 10/06/23  Franchot Harlene SQUIBB, PMHNP  thiamine (VITAMIN B-1) 100 MG tablet Take 100 mg by mouth daily.    [provider]  traZODone  (DESYREL ) 100 MG tablet Take 1/2-1 tablet po QHS prn insomnia Patient not taking: Reported on 07/08/2023 12/09/22   Franchot Harlene SQUIBB, PMHNP    Allergies: Patient has no known allergies.    Review of Systems  Cardiovascular:  Positive for chest pain.  All other systems reviewed and are negative.   Updated Vital Signs There were no vitals taken for this visit.  Physical Exam Vitals and nursing note reviewed.   33 year old female, resting comfortably and in no acute distress. Vital signs are significant for elevated diastolic blood pressure. Oxygen saturation is  99%, which is normal. Head is normocephalic and atraumatic. PERRLA, EOMI. Oropharynx is clear. Neck is nontender and supple without adenopathy. Lungs are clear without rales, wheezes, or rhonchi. Chest is nontender. Heart has regular rate and rhythm without murmur. Abdomen is soft, flat, nontender. Extremities have no cyanosis or edema, full range of motion is present. Skin is warm and dry without rash. Neurologic: Mental status is normal, cranial nerves are intact,  moves all extremities equally.  (all labs ordered are listed, but only abnormal results are displayed) Labs Reviewed  BASIC METABOLIC PANEL WITH GFR - Abnormal; Notable for the following components:      Result Value   Glucose, Bld 104 (*)    All other components within normal limits  CBC WITH DIFFERENTIAL/PLATELET  HCG, SERUM, QUALITATIVE  D-DIMER, QUANTITATIVE (NOT AT [facility])  TROPONIN T, HIGH SENSITIVITY    EKG: EKG Interpretation Date/Time:  Thursday August 24 2024 05:06:02 EST Ventricular Rate:  70 PR Interval:  148 QRS Duration:  82 QT Interval:  386 QTC Calculation: 417 R Axis:   93  Text Interpretation: Sinus rhythm Consider right atrial enlargement Borderline right axis deviation Borderline T wave abnormalities No old tracing to compare Confirmed by Raford Lenis ([id number]) on 08/24/2024 5:08:41 AM  Radiology: No results found.  Cardiac monitor shows normal sinus rhythm, per my interpretation.  Procedures   Medications Ordered in the ED  nitroGLYCERIN  (NITROSTAT ) SL tablet 0.4 mg (has no administration in time range)  aspirin  chewable tablet 324 mg (324 mg Oral Given 08/24/24 0534)                                    Medical Decision Making Amount and/or Complexity of Data Reviewed Labs: ordered. Radiology: ordered.  Risk OTC drugs. Prescription drug management.   Chest discomfort.  This is a presentation with wide range of treatment options and carries with a high risk of morbidity and complications.  Differential diagnosis includes, but is not limited to, ACS, Takotsubo cardiomyopathy, pulmonary embolism, GERD, pneumonia.  I have reviewed her electrocardiogram, and my interpretation is borderline right axis deviation, borderline T wave abnormalities with no prior ECG available for comparison.  I have ordered a dose of aspirin  and nitroglycerin .  I have ordered screening labs and chest x-ray.  I have reviewed her initial laboratory tests, and my  interpretation is not pregnant, normal CBC, borderline elevated random glucose level which will need to be followed as an outpatient, normal initial troponin.  Repeat troponin is pending.  As of this point, she has not received nitroglycerin , I am reordering it.  Repeat troponin is pending.  Case is signed out to Dr. Cottie, oncoming physician.     Final diagnoses:  Chest discomfort  Elevated random blood glucose level    ED Discharge Orders     None          Raford Lenis, MD 08/24/24 [phone]  "

## 2024-08-24 NOTE — ED Provider Notes (Signed)
 33 yo female here with chest pain Ddimer negative Trop negative ECG nonischemic  Pending repeat troponin, likely needing cardiology referral if stable for discharge  Physical Exam  BP 122/82   Pulse 66   Temp 98.1 F (36.7 C)   Resp 20   SpO2 98%   Physical Exam  Procedures  Procedures  ED Course / MDM    Medical Decision Making Amount and/or Complexity of Data Reviewed Labs: ordered. Radiology: ordered.  Risk OTC drugs. Prescription drug management.    Patient was reassessed following her workup.  Delta troponins were negative.  She was given sublingual nitroglycerin  and feels that this relieved her pain.  Her vital signs have otherwise been unremarkable and telemetry shows a sinus rhythm with occasional PVCs.  Given that she is asymptomatic, think is reasonable to discharge her home with cardiology follow-up.  I do think cardiology follow-up is reasonable given that her chest pressure was relieved with nitroglycerin .  She does vape and use nicotine.  She does not know her family history.  She does not otherwise have any acute cardiovascular risk factors.  HEART score of 2 - low risk of MACE, but directed to outpatient follow up  The patient and her partner at the bedside are both in agreement this plan.  Return precautions were discussed.  With an undetectable or negative D-dimer, the low suspicion at this time for acute pulmonary embolism.  Doubt sepsis, pneumonia, aortic dissection, or other life-threatening causes of chest discomfort.    Cottie Donnice PARAS, MD 08/24/24 [phone]

## 2024-09-12 ENCOUNTER — Ambulatory Visit: Admitting: Student

## 2024-09-22 ENCOUNTER — Ambulatory Visit: Admitting: Physician Assistant
